# Patient Record
Sex: Female | Born: 1995 | Race: White | Hispanic: No | Marital: Single | Smoking: Current every day smoker
Health system: Southern US, Community
[De-identification: ages and names within clinical notes are randomized; demographics above are authoritative.]

## PROBLEM LIST (undated history)

## (undated) ENCOUNTER — Inpatient Hospital Stay (HOSPITAL_COMMUNITY): Payer: Self-pay

## (undated) ENCOUNTER — Emergency Department (HOSPITAL_COMMUNITY): Admission: EM | Payer: Medicaid Other

## (undated) DIAGNOSIS — IMO0002 Reserved for concepts with insufficient information to code with codable children: Secondary | ICD-10-CM

## (undated) DIAGNOSIS — F329 Major depressive disorder, single episode, unspecified: Secondary | ICD-10-CM

## (undated) DIAGNOSIS — F419 Anxiety disorder, unspecified: Secondary | ICD-10-CM

## (undated) DIAGNOSIS — T148XXA Other injury of unspecified body region, initial encounter: Secondary | ICD-10-CM

## (undated) DIAGNOSIS — F32A Depression, unspecified: Secondary | ICD-10-CM

## (undated) DIAGNOSIS — F4310 Post-traumatic stress disorder, unspecified: Secondary | ICD-10-CM

## (undated) HISTORY — DX: Post-traumatic stress disorder, unspecified: F43.10

## (undated) HISTORY — PX: TONSILLECTOMY AND ADENOIDECTOMY: SUR1326

## (undated) HISTORY — DX: Anxiety disorder, unspecified: F41.9

## (undated) HISTORY — PX: OTHER SURGICAL HISTORY: SHX169

---
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
## 2013-03-20 ENCOUNTER — Encounter (HOSPITAL_COMMUNITY): Payer: Self-pay | Admitting: *Deleted

## 2013-03-20 ENCOUNTER — Inpatient Hospital Stay (HOSPITAL_COMMUNITY)
Admission: AD | Admit: 2013-03-20 | Discharge: 2013-03-20 | Disposition: A | Discharge status: Home / Self Care | Payer: Medicaid Other | Source: Ambulatory Visit | Attending: Obstetrics & Gynecology | Admitting: Obstetrics & Gynecology

## 2013-03-20 DIAGNOSIS — O212 Late vomiting of pregnancy: Secondary | ICD-10-CM | POA: Insufficient documentation

## 2013-03-20 DIAGNOSIS — O479 False labor, unspecified: Secondary | ICD-10-CM | POA: Insufficient documentation

## 2013-03-20 LAB — OB RESULTS CONSOLE GC/CHLAMYDIA: Gonorrhea: NEGATIVE

## 2013-03-20 LAB — URINALYSIS, ROUTINE W REFLEX MICROSCOPIC
Bilirubin Urine: NEGATIVE
Hgb urine dipstick: NEGATIVE
Ketones, ur: NEGATIVE mg/dL
Nitrite: NEGATIVE
Urobilinogen, UA: 1 mg/dL (ref 0.0–1.0)
pH: 7 (ref 5.0–8.0)

## 2013-03-20 LAB — COMPREHENSIVE METABOLIC PANEL
ALT: 8 U/L (ref 0–35)
Alkaline Phosphatase: 207 U/L — ABNORMAL HIGH (ref 39–117)
CO2: 19 mEq/L (ref 19–32)
Chloride: 105 mEq/L (ref 96–112)
GFR calc Af Amer: >90 mL/min (ref >90)
GFR calc non Af Amer: >90 mL/min (ref >90)
Glucose, Bld: 79 mg/dL (ref 70–99)
Potassium: 3.5 mEq/L (ref 3.5–5.1)
Sodium: 136 mEq/L (ref 135–145)

## 2013-03-20 LAB — CBC
HCT: 33.2 % — ABNORMAL LOW (ref 36.0–46.0)
MCH: 27.2 pg (ref 26.0–34.0)
MCHC: 33.4 g/dL (ref 30.0–36.0)
MCV: 81.4 fL (ref 78.0–100.0)
RDW: 14.9 % (ref 11.5–15.5)

## 2013-03-20 MED ORDER — LACTATED RINGERS IV BOLUS (SEPSIS)
500.0000 mL | Freq: Once | INTRAVENOUS | Status: AC
  Administered 2013-03-20: 500 mL via INTRAVENOUS

## 2013-03-20 NOTE — MAU Note (Signed)
Pt presents with possible some at 9 pm last night.  Pt with only occasional contractions.  Pt with nausea and vomiting 5 times today.  Pt denies bleeding.  + fm per pt.

## 2013-04-08 ENCOUNTER — Inpatient Hospital Stay (HOSPITAL_COMMUNITY)
Admission: AD | Admit: 2013-04-08 | Discharge: 2013-04-08 | Disposition: A | Discharge status: Home / Self Care | Payer: Medicaid Other | Source: Ambulatory Visit | Attending: Obstetrics and Gynecology | Admitting: Obstetrics and Gynecology

## 2013-04-08 ENCOUNTER — Encounter (HOSPITAL_COMMUNITY): Payer: Self-pay | Admitting: *Deleted

## 2013-04-08 DIAGNOSIS — O479 False labor, unspecified: Secondary | ICD-10-CM | POA: Insufficient documentation

## 2013-04-08 NOTE — MAU Note (Signed)
Pt reports having ctx on and off since 4am . repots clear mucusy discharge and good fetal movement

## 2013-04-10 ENCOUNTER — Encounter (HOSPITAL_COMMUNITY): Payer: Self-pay | Admitting: Family

## 2013-04-10 ENCOUNTER — Inpatient Hospital Stay (HOSPITAL_COMMUNITY)
Admission: AD | Admit: 2013-04-10 | Discharge: 2013-04-10 | Disposition: A | Discharge status: Home / Self Care | Payer: Medicaid Other | Source: Ambulatory Visit | Attending: Obstetrics & Gynecology | Admitting: Obstetrics & Gynecology

## 2013-04-10 DIAGNOSIS — O368190 Decreased fetal movements, unspecified trimester, not applicable or unspecified: Secondary | ICD-10-CM | POA: Insufficient documentation

## 2013-04-10 DIAGNOSIS — R1013 Epigastric pain: Secondary | ICD-10-CM | POA: Insufficient documentation

## 2013-04-10 NOTE — MAU Note (Signed)
Patient reports smoking cessation approximately 2 weeks ago. Denies cigarette use since that time.

## 2013-04-10 NOTE — MAU Note (Signed)
No movement felt today.  Sharp, constant pain in upper mid abd, lot of pressure up there.increased d/c last couple days, blood mixed in . Was 2+ on sat when checked here.

## 2013-04-10 NOTE — MAU Note (Addendum)
Patient presents to MAU with c/o decreased FM - reports no movement today; patient reports she walked around and was on her feet more than usual this weekend.  Reports bloody, mucous show. Reports constant stabbing upper abdominal/epigastric pain. Denies HA, blurred vision.  Was seen at MAU on Saturday, SVE 2.5/3 cm. Hx of induction/VD at 41 wks with last pregnancy. Denies HSV.

## 2013-04-11 ENCOUNTER — Inpatient Hospital Stay (HOSPITAL_COMMUNITY)
Admission: AD | Admit: 2013-04-11 | Discharge: 2013-04-13 | DRG: 775 | Disposition: A | Discharge status: Home / Self Care | Payer: Medicaid Other | Source: Ambulatory Visit | Attending: Obstetrics and Gynecology | Admitting: Obstetrics and Gynecology

## 2013-04-11 ENCOUNTER — Encounter (HOSPITAL_COMMUNITY): Payer: Self-pay | Admitting: *Deleted

## 2013-04-11 ENCOUNTER — Inpatient Hospital Stay (HOSPITAL_COMMUNITY): Payer: Medicaid Other | Admitting: Anesthesiology

## 2013-04-11 ENCOUNTER — Encounter (HOSPITAL_COMMUNITY): Payer: Self-pay | Admitting: Anesthesiology

## 2013-04-11 DIAGNOSIS — O368190 Decreased fetal movements, unspecified trimester, not applicable or unspecified: Principal | ICD-10-CM | POA: Diagnosis present

## 2013-04-11 DIAGNOSIS — Z22330 Carrier of Group B streptococcus: Secondary | ICD-10-CM

## 2013-04-11 DIAGNOSIS — O99892 Other specified diseases and conditions complicating childbirth: Secondary | ICD-10-CM | POA: Diagnosis present

## 2013-04-11 LAB — CBC
HCT: 34.2 % — ABNORMAL LOW (ref 36.0–46.0)
Hemoglobin: 11.4 g/dL — ABNORMAL LOW (ref 12.0–15.0)
MCH: 26.6 pg (ref 26.0–34.0)
MCHC: 33.3 g/dL (ref 30.0–36.0)
MCV: 79.7 fL (ref 78.0–100.0)
RBC: 4.29 MIL/uL (ref 3.87–5.11)

## 2013-04-11 MED ORDER — OXYTOCIN BOLUS FROM INFUSION
500.0000 mL | INTRAVENOUS | Status: DC
  Administered 2013-04-11: 500 mL via INTRAVENOUS

## 2013-04-11 MED ORDER — MEASLES, MUMPS & RUBELLA VAC ~~LOC~~ INJ
0.5000 mL | INJECTION | Freq: Once | SUBCUTANEOUS | Status: DC
  Filled 2013-04-11: qty 0.5

## 2013-04-11 MED ORDER — PRENATAL MULTIVITAMIN CH
1.0000 | ORAL_TABLET | Freq: Every day | ORAL | Status: DC
  Administered 2013-04-12: 1 via ORAL
  Filled 2013-04-11: qty 1

## 2013-04-11 MED ORDER — DIPHENHYDRAMINE HCL 50 MG/ML IJ SOLN: 12.5000 mg | INTRAMUSCULAR | Status: DC | PRN

## 2013-04-11 MED ORDER — LACTATED RINGERS IV SOLN
INTRAVENOUS | Status: DC
  Administered 2013-04-11: 17:00:00 via INTRAVENOUS
  Administered 2013-04-11: 125 mL/h via INTRAVENOUS

## 2013-04-11 MED ORDER — LACTATED RINGERS IV SOLN
500.0000 mL | Freq: Once | INTRAVENOUS | Status: AC
  Administered 2013-04-11: 500 mL via INTRAVENOUS

## 2013-04-11 MED ORDER — PHENYLEPHRINE 40 MCG/ML (10ML) SYRINGE FOR IV PUSH (FOR BLOOD PRESSURE SUPPORT)
80.0000 ug | PREFILLED_SYRINGE | INTRAVENOUS | Status: DC | PRN
  Filled 2013-04-11: qty 5
  Filled 2013-04-11: qty 2

## 2013-04-11 MED ORDER — METHYLERGONOVINE MALEATE 0.2 MG/ML IJ SOLN: 0.2000 mg | INTRAMUSCULAR | Status: DC | PRN

## 2013-04-11 MED ORDER — SENNOSIDES-DOCUSATE SODIUM 8.6-50 MG PO TABS
2.0000 | ORAL_TABLET | Freq: Every day | ORAL | Status: DC
  Administered 2013-04-12: 2 via ORAL

## 2013-04-11 MED ORDER — SODIUM CHLORIDE 0.9 % IV SOLN: 250.0000 mL | INTRAVENOUS | Status: DC | PRN

## 2013-04-11 MED ORDER — SODIUM CHLORIDE 0.9 % IJ SOLN: 3.0000 mL | INTRAMUSCULAR | Status: DC | PRN

## 2013-04-11 MED ORDER — ONDANSETRON HCL 4 MG PO TABS: 4.0000 mg | ORAL_TABLET | ORAL | Status: DC | PRN

## 2013-04-11 MED ORDER — SODIUM BICARBONATE 8.4 % IV SOLN
INTRAVENOUS | Status: DC | PRN
  Administered 2013-04-11: 5 mL via EPIDURAL

## 2013-04-11 MED ORDER — PHENYLEPHRINE 40 MCG/ML (10ML) SYRINGE FOR IV PUSH (FOR BLOOD PRESSURE SUPPORT)
80.0000 ug | PREFILLED_SYRINGE | INTRAVENOUS | Status: DC | PRN
  Filled 2013-04-11: qty 2

## 2013-04-11 MED ORDER — TETANUS-DIPHTH-ACELL PERTUSSIS 5-2.5-18.5 LF-MCG/0.5 IM SUSP
0.5000 mL | Freq: Once | INTRAMUSCULAR | Status: AC
  Administered 2013-04-12: 0.5 mL via INTRAMUSCULAR

## 2013-04-11 MED ORDER — SODIUM CHLORIDE 0.9 % IJ SOLN: 3.0000 mL | Freq: Two times a day (BID) | INTRAMUSCULAR | Status: DC

## 2013-04-11 MED ORDER — LIDOCAINE HCL (PF) 1 % IJ SOLN
30.0000 mL | INTRAMUSCULAR | Status: DC | PRN
  Administered 2013-04-11: 30 mL via SUBCUTANEOUS
  Filled 2013-04-11 (×2): qty 30

## 2013-04-11 MED ORDER — DIBUCAINE 1 % RE OINT: 1.0000 "application " | TOPICAL_OINTMENT | RECTAL | Status: DC | PRN

## 2013-04-11 MED ORDER — EPHEDRINE 5 MG/ML INJ
10.0000 mg | INTRAVENOUS | Status: DC | PRN
  Filled 2013-04-11: qty 4
  Filled 2013-04-11: qty 2

## 2013-04-11 MED ORDER — SIMETHICONE 80 MG PO CHEW: 80.0000 mg | CHEWABLE_TABLET | ORAL | Status: DC | PRN

## 2013-04-11 MED ORDER — METHYLERGONOVINE MALEATE 0.2 MG PO TABS
ORAL_TABLET | ORAL | Status: AC
  Filled 2013-04-11: qty 1

## 2013-04-11 MED ORDER — METHYLERGONOVINE MALEATE 0.2 MG PO TABS
0.2000 mg | ORAL_TABLET | ORAL | Status: DC | PRN
  Administered 2013-04-11: 0.2 mg via ORAL

## 2013-04-11 MED ORDER — OXYCODONE-ACETAMINOPHEN 5-325 MG PO TABS: 1.0000 | ORAL_TABLET | ORAL | Status: DC | PRN

## 2013-04-11 MED ORDER — DIPHENHYDRAMINE HCL 25 MG PO CAPS: 25.0000 mg | ORAL_CAPSULE | Freq: Four times a day (QID) | ORAL | Status: DC | PRN

## 2013-04-11 MED ORDER — IBUPROFEN 800 MG PO TABS
800.0000 mg | ORAL_TABLET | Freq: Three times a day (TID) | ORAL | Status: DC
  Administered 2013-04-12 – 2013-04-13 (×3): 800 mg via ORAL
  Filled 2013-04-11 (×3): qty 1

## 2013-04-11 MED ORDER — WITCH HAZEL-GLYCERIN EX PADS: 1.0000 "application " | MEDICATED_PAD | CUTANEOUS | Status: DC | PRN

## 2013-04-11 MED ORDER — FERROUS SULFATE 325 (65 FE) MG PO TABS
325.0000 mg | ORAL_TABLET | Freq: Two times a day (BID) | ORAL | Status: DC
  Administered 2013-04-12 – 2013-04-13 (×3): 325 mg via ORAL
  Filled 2013-04-11 (×4): qty 1

## 2013-04-11 MED ORDER — OXYTOCIN 40 UNITS IN LACTATED RINGERS INFUSION - SIMPLE MED: 62.5000 mL/h | INTRAVENOUS | Status: DC

## 2013-04-11 MED ORDER — ZOLPIDEM TARTRATE 5 MG PO TABS: 5.0000 mg | ORAL_TABLET | Freq: Every evening | ORAL | Status: DC | PRN

## 2013-04-11 MED ORDER — IBUPROFEN 600 MG PO TABS
600.0000 mg | ORAL_TABLET | Freq: Four times a day (QID) | ORAL | Status: DC | PRN
  Filled 2013-04-11: qty 1

## 2013-04-11 MED ORDER — OXYCODONE-ACETAMINOPHEN 5-325 MG PO TABS
1.0000 | ORAL_TABLET | ORAL | Status: DC | PRN
  Administered 2013-04-12 (×2): 1 via ORAL
  Administered 2013-04-12 – 2013-04-13 (×2): 2 via ORAL
  Filled 2013-04-11 (×2): qty 2
  Filled 2013-04-11 (×2): qty 1

## 2013-04-11 MED ORDER — MAGNESIUM HYDROXIDE 400 MG/5ML PO SUSP: 30.0000 mL | ORAL | Status: DC | PRN

## 2013-04-11 MED ORDER — LACTATED RINGERS IV SOLN: 500.0000 mL | INTRAVENOUS | Status: DC | PRN

## 2013-04-11 MED ORDER — CITRIC ACID-SODIUM CITRATE 334-500 MG/5ML PO SOLN: 30.0000 mL | ORAL | Status: DC | PRN

## 2013-04-11 MED ORDER — ONDANSETRON HCL 4 MG/2ML IJ SOLN: 4.0000 mg | Freq: Four times a day (QID) | INTRAMUSCULAR | Status: DC | PRN

## 2013-04-11 MED ORDER — LANOLIN HYDROUS EX OINT: TOPICAL_OINTMENT | CUTANEOUS | Status: DC | PRN

## 2013-04-11 MED ORDER — ONDANSETRON HCL 4 MG/2ML IJ SOLN: 4.0000 mg | INTRAMUSCULAR | Status: DC | PRN

## 2013-04-11 MED ORDER — FLEET ENEMA 7-19 GM/118ML RE ENEM: 1.0000 | ENEMA | RECTAL | Status: DC | PRN

## 2013-04-11 MED ORDER — IBUPROFEN 800 MG PO TABS
800.0000 mg | ORAL_TABLET | Freq: Three times a day (TID) | ORAL | Status: DC
  Administered 2013-04-11: 800 mg via ORAL

## 2013-04-11 MED ORDER — FENTANYL 2.5 MCG/ML BUPIVACAINE 1/10 % EPIDURAL INFUSION (WH - ANES)
14.0000 mL/h | INTRAMUSCULAR | Status: DC | PRN
  Administered 2013-04-11: 14 mL/h via EPIDURAL
  Filled 2013-04-11: qty 125

## 2013-04-11 MED ORDER — BENZOCAINE-MENTHOL 20-0.5 % EX AERO: 1.0000 "application " | INHALATION_SPRAY | CUTANEOUS | Status: DC | PRN

## 2013-04-11 MED ORDER — OXYTOCIN 40 UNITS IN LACTATED RINGERS INFUSION - SIMPLE MED
1.0000 m[IU]/min | INTRAVENOUS | Status: DC
  Administered 2013-04-11: 2 m[IU]/min via INTRAVENOUS
  Filled 2013-04-11: qty 1000

## 2013-04-11 MED ORDER — SODIUM CHLORIDE 0.9 % IV SOLN
2.0000 g | Freq: Four times a day (QID) | INTRAVENOUS | Status: DC
  Administered 2013-04-11: 2 g via INTRAVENOUS
  Filled 2013-04-11 (×2): qty 2000

## 2013-04-11 MED ORDER — TERBUTALINE SULFATE 1 MG/ML IJ SOLN: 0.2500 mg | Freq: Once | INTRAMUSCULAR | Status: DC | PRN

## 2013-04-11 MED ORDER — ACETAMINOPHEN 325 MG PO TABS
650.0000 mg | ORAL_TABLET | ORAL | Status: DC | PRN
  Administered 2013-04-11: 650 mg via ORAL
  Filled 2013-04-11: qty 2

## 2013-04-11 MED ORDER — EPHEDRINE 5 MG/ML INJ
10.0000 mg | INTRAVENOUS | Status: DC | PRN
  Filled 2013-04-11: qty 2

## 2013-04-11 NOTE — Plan of Care (Signed)
Problem: Consults Goal: Birthing Suites Patient Information Press F2 to bring up selections list  Outcome: Completed/Met Date Met:  04/11/13  Pt > [redacted] weeks EGA

## 2013-04-11 NOTE — H&P (Signed)
17 y.o. [redacted]w[redacted]d  G2P1001 comes in c/o decreased FM again but when checked today was found to be 5 cm.  Otherwise has good fetal movement and no bleeding.  Past Medical History  Diagnosis Date  . Medical history non-contributory     Past Surgical History  Procedure Laterality Date  . Tonsillectomy    . Adnoids      OB History  Gravida Para Term Preterm AB SAB TAB Ectopic Multiple Living  2 1 1       1     # Outcome Date GA Lbr Len/2nd Weight Sex Delivery Anes PTL Lv  2 CUR           1 TRM     F SVD EPI N Y      History   Social History  . Marital Status: Single    Spouse Name: N/A    Number of Children: N/A  . Years of Education: N/A   Occupational History  . Not on file.   Social History Main Topics  . Smoking status: Former Smoker -- 0.25 packs/day    Types: Cigarettes    Quit date: 03/12/2013  . Smokeless tobacco: Not on file  . Alcohol Use: No  . Drug Use: No  . Sexual Activity: Yes   Other Topics Concern  . Not on file   Social History Narrative  . No narrative on file   Review of patient's allergies indicates no known allergies.    Prenatal Transfer Tool  Maternal Diabetes: No Genetic Screening: Declined Maternal Ultrasounds/Referrals: Normal Fetal Ultrasounds or other Referrals:  None Maternal Substance Abuse:  No Significant Maternal Medications:  None Significant Maternal Lab Results: None  Other PNC:    Filed Vitals:   04/11/13 1621  BP: 119/70  Pulse: 92  Temp: 98.2 F (36.8 C)  Resp: 18     Lungs/Cor:  NAD Abdomen:  soft, gravid Ex:  no cords, erythema SVE:  5/C/-2, BBOW FHTs:  140s, good STV, NST R Toco:  q4-5   A/P   Term labor with reassuring FHTs.  GBS pos on UCX- PCN.  Victorhugo Preis A

## 2013-04-11 NOTE — Anesthesia Preprocedure Evaluation (Signed)

## 2013-04-11 NOTE — Anesthesia Procedure Notes (Signed)

## 2013-04-11 NOTE — MAU Note (Signed)
Patient is in with c/o no fetal movement all day and ctx q5-74mins for 3 hours. She denies vaginal bleeding or lof. fht 163-164. Patient states that she was 2cm last week

## 2013-04-12 LAB — RPR: RPR Ser Ql: NONREACTIVE

## 2013-04-12 LAB — CBC
HCT: 34.6 % — ABNORMAL LOW (ref 36.0–46.0)
Hemoglobin: 11.3 g/dL — ABNORMAL LOW (ref 12.0–15.0)
WBC: 19.9 10*3/uL — ABNORMAL HIGH (ref 4.0–10.5)

## 2013-04-12 NOTE — Anesthesia Postprocedure Evaluation (Signed)
  Anesthesia Post-op Note  Patient: Carrie Peterson  Procedure(s) Performed: Lumbar Epidural for L&D  Patient Location: Mother/Baby  Anesthesia Type:Epidural  Level of Consciousness: awake, alert  and oriented  Airway and Oxygen Therapy: Patient Spontanous Breathing  Post-op Pain: 7 /10. Patient complaining of Right sided lower back pain radiating up to her mid thoracic area on the right side. Tender to palpation of right paraspinous muscles. Pain began approximately 2 1/2 hours ago. Suspect she has paraspinous muscle strain which manifested after full effects of epidural analgesia has subsided.  Post-op Assessment: Post-op Vital signs reviewed, Patient's Cardiovascular Status Stable, Respiratory Function Stable, Patent Airway, No signs of Nausea or vomiting, No headache, No residual numbness and No residual motor weakness  Post-op Vital Signs: Reviewed and stable  Complications: No apparent anesthesia complications. Sus

## 2013-04-12 NOTE — Progress Notes (Signed)
Patient is eating, ambulating, voiding.  Pain control is good.  Appropriate lochia.  No complaints.  Filed Vitals:   04/11/13 2247 04/11/13 2320 04/12/13 0054 04/12/13 0537  BP: 112/74  122/79 110/75  Pulse: 89 54 75 62  Temp:  98.5 F (36.9 C) 97.7 F (36.5 C) 97.5 F (36.4 C)  TempSrc:  Oral Oral Oral  Resp: 18 18 18 18   Height:      Weight:      SpO2:   98% 98%    Fundus firm Perineum without swelling. No CT  Lab Results  Component Value Date   WBC 19.9* 04/12/2013   HGB 11.3* 04/12/2013   HCT 34.6* 04/12/2013   MCV 80.5 04/12/2013   PLT 195 04/12/2013    --/--/B POS (01/24 1758)  A/P Post partum day 1 ( from 10p).  Routine care.  Expect d/c 9/4  Philip Aspen

## 2013-04-12 NOTE — Progress Notes (Signed)
UR chart review completed.  

## 2013-04-12 NOTE — Progress Notes (Signed)
1610 Pt complained of shooting pain up her right side of her back from the midpoint up.. Assessed the  back area no signs of redness or swelling.  Pt ambulated in the room, no complaints of numbness.  Carrie Goodell MD.  Order given to apply ice pack and if that does not help,  apply heat.  MD will assign health care personnel to evaluate patient shortly.  Ice pack applied.  Will continue to monitor. Carrie Peterson

## 2013-04-13 MED ORDER — DOCUSATE SODIUM 100 MG PO CAPS: 100.0000 mg | ORAL_CAPSULE | Freq: Two times a day (BID) | ORAL | Status: DC

## 2013-04-13 MED ORDER — OXYCODONE-ACETAMINOPHEN 5-325 MG PO TABS: 2.0000 | ORAL_TABLET | ORAL | Status: DC | PRN

## 2013-04-13 MED ORDER — IBUPROFEN 600 MG PO TABS: 600.0000 mg | ORAL_TABLET | Freq: Four times a day (QID) | ORAL | Status: DC | PRN

## 2013-04-13 NOTE — Discharge Summary (Signed)
Obstetric Discharge Summary Reason for Admission: induction of labor Prenatal Procedures: ultrasound Intrapartum Procedures: spontaneous vaginal delivery Postpartum Procedures: none Complications-Operative and Postpartum: right labial laceration Hemoglobin  Date Value Range Status  04/12/2013 11.3* 12.0 - 15.0 g/dL Final     HCT  Date Value Range Status  04/12/2013 34.6* 36.0 - 46.0 % Final    Physical Exam:  General: alert, cooperative and appears stated age Lochia: appropriate Uterine Fundus: firm  Discharge Diagnoses: Term Pregnancy-delivered  Discharge Information: Date: 04/13/2013 Activity: pelvic rest Diet: routine Medications: Ibuprofen, Colace and Percocet Condition: improved Instructions: refer to practice specific booklet Discharge to: home Follow-up Information   Follow up with HORVATH,MICHELLE A, MD In 4 weeks. (For a postpartm evaluation)    Specialty:  Obstetrics and Gynecology   Contact information:   [address] [place] [address] [phone]       Newborn Data: Live born female  Birth Weight: 8 lb 15 oz (4055 g) APGAR: 8, 9  Home with mother.  Cele Mote H. 04/13/2013, 8:27 AM

## 2014-02-05 DIAGNOSIS — R1110 Vomiting, unspecified: Secondary | ICD-10-CM | POA: Insufficient documentation

## 2014-02-11 ENCOUNTER — Emergency Department (HOSPITAL_COMMUNITY)
Admission: EM | Admit: 2014-02-11 | Discharge: 2014-02-11 | Disposition: A | Discharge status: Home / Self Care | Payer: Managed Care, Other (non HMO) | Attending: Emergency Medicine | Admitting: Emergency Medicine

## 2014-02-11 ENCOUNTER — Encounter (HOSPITAL_COMMUNITY): Payer: Self-pay | Admitting: Emergency Medicine

## 2014-02-11 DIAGNOSIS — Z792 Long term (current) use of antibiotics: Secondary | ICD-10-CM | POA: Insufficient documentation

## 2014-02-11 DIAGNOSIS — T3695XA Adverse effect of unspecified systemic antibiotic, initial encounter: Secondary | ICD-10-CM | POA: Insufficient documentation

## 2014-02-11 DIAGNOSIS — Z87891 Personal history of nicotine dependence: Secondary | ICD-10-CM | POA: Insufficient documentation

## 2014-02-11 DIAGNOSIS — Z8719 Personal history of other diseases of the digestive system: Secondary | ICD-10-CM | POA: Insufficient documentation

## 2014-02-11 DIAGNOSIS — T471X5A Adverse effect of other antacids and anti-gastric-secretion drugs, initial encounter: Secondary | ICD-10-CM | POA: Insufficient documentation

## 2014-02-11 DIAGNOSIS — F3289 Other specified depressive episodes: Secondary | ICD-10-CM | POA: Insufficient documentation

## 2014-02-11 DIAGNOSIS — Z3202 Encounter for pregnancy test, result negative: Secondary | ICD-10-CM | POA: Insufficient documentation

## 2014-02-11 DIAGNOSIS — Z79899 Other long term (current) drug therapy: Secondary | ICD-10-CM | POA: Insufficient documentation

## 2014-02-11 DIAGNOSIS — F329 Major depressive disorder, single episode, unspecified: Secondary | ICD-10-CM | POA: Insufficient documentation

## 2014-02-11 DIAGNOSIS — T50905A Adverse effect of unspecified drugs, medicaments and biological substances, initial encounter: Secondary | ICD-10-CM

## 2014-02-11 DIAGNOSIS — R197 Diarrhea, unspecified: Secondary | ICD-10-CM | POA: Insufficient documentation

## 2014-02-11 DIAGNOSIS — R112 Nausea with vomiting, unspecified: Secondary | ICD-10-CM | POA: Insufficient documentation

## 2014-02-11 DIAGNOSIS — R1012 Left upper quadrant pain: Secondary | ICD-10-CM | POA: Insufficient documentation

## 2014-02-11 DIAGNOSIS — T43205A Adverse effect of unspecified antidepressants, initial encounter: Secondary | ICD-10-CM | POA: Insufficient documentation

## 2014-02-11 HISTORY — DX: Major depressive disorder, single episode, unspecified: F32.9

## 2014-02-11 HISTORY — DX: Depression, unspecified: F32.A

## 2014-02-11 HISTORY — DX: Reserved for concepts with insufficient information to code with codable children: IMO0002

## 2014-02-11 LAB — BASIC METABOLIC PANEL
Anion gap: 13 (ref 5–15)
BUN: 4 mg/dL — AB (ref 6–23)
CALCIUM: 9.3 mg/dL (ref 8.4–10.5)
CO2: 21 mEq/L (ref 19–32)
Chloride: 108 mEq/L (ref 96–112)
Creatinine, Ser: 0.87 mg/dL (ref 0.50–1.10)
GFR calc Af Amer: >90 mL/min (ref >90)
Glucose, Bld: 87 mg/dL (ref 70–99)
Potassium: 3.5 mEq/L — ABNORMAL LOW (ref 3.7–5.3)
Sodium: 142 mEq/L (ref 137–147)

## 2014-02-11 LAB — CBC WITH DIFFERENTIAL/PLATELET
BASOS PCT: 0 % (ref 0–1)
Basophils Absolute: 0 10*3/uL (ref 0.0–0.1)
EOS ABS: 0.1 10*3/uL (ref 0.0–0.7)
Eosinophils Relative: 1 % (ref 0–5)
HCT: 39.9 % (ref 36.0–46.0)
HEMOGLOBIN: 13.6 g/dL (ref 12.0–15.0)
Lymphocytes Relative: 19 % (ref 12–46)
Lymphs Abs: 1.7 10*3/uL (ref 0.7–4.0)
MCH: 30.4 pg (ref 26.0–34.0)
MCHC: 34.1 g/dL (ref 30.0–36.0)
MCV: 89.3 fL (ref 78.0–100.0)
MONOS PCT: 6 % (ref 3–12)
Monocytes Absolute: 0.6 10*3/uL (ref 0.1–1.0)
Neutro Abs: 6.9 10*3/uL (ref 1.7–7.7)
Neutrophils Relative %: 74 % (ref 43–77)
Platelets: 250 10*3/uL (ref 150–400)
RBC: 4.47 MIL/uL (ref 3.87–5.11)
RDW: 14.6 % (ref 11.5–15.5)
WBC: 9.3 10*3/uL (ref 4.0–10.5)

## 2014-02-11 LAB — URINE MICROSCOPIC-ADD ON

## 2014-02-11 LAB — POC OCCULT BLOOD, ED: Fecal Occult Bld: NEGATIVE

## 2014-02-11 LAB — URINALYSIS, ROUTINE W REFLEX MICROSCOPIC
Bilirubin Urine: NEGATIVE
GLUCOSE, UA: NEGATIVE mg/dL
KETONES UR: NEGATIVE mg/dL
LEUKOCYTES UA: NEGATIVE
Nitrite: NEGATIVE
PH: 7.5 (ref 5.0–8.0)
Protein, ur: NEGATIVE mg/dL
Specific Gravity, Urine: 1.008 (ref 1.005–1.030)
Urobilinogen, UA: 0.2 mg/dL (ref 0.0–1.0)

## 2014-02-11 LAB — POC URINE PREG, ED: Preg Test, Ur: NEGATIVE

## 2014-02-11 LAB — LIPASE, BLOOD: Lipase: 35 U/L (ref 11–59)

## 2014-02-11 MED ORDER — ONDANSETRON HCL 4 MG PO TABS: 4.0000 mg | ORAL_TABLET | Freq: Four times a day (QID) | ORAL | Status: DC

## 2014-02-11 MED ORDER — GI COCKTAIL ~~LOC~~
30.0000 mL | Freq: Once | ORAL | Status: AC
  Administered 2014-02-11: 30 mL via ORAL
  Filled 2014-02-11: qty 30

## 2014-02-11 NOTE — ED Notes (Signed)
Pt escorted to discharge window. Pt verbalized understanding discharge instructions. In no acute distress.  

## 2014-02-11 NOTE — ED Provider Notes (Signed)
CSN: [id number]     Arrival date & time 02/11/14  1242 History   First MD Initiated Contact with Patient 02/11/14 1243     Chief Complaint  Patient presents with  . Abdominal Pain     (Consider location/radiation/quality/duration/timing/severity/associated sxs/prior Treatment) Patient is a [age] y.o. female presenting with abdominal pain.  Abdominal Pain Pain location:  LUQ Pain quality: sharp   Pain radiates to:  Does not radiate Pain severity:  Severe Onset quality:  Gradual Duration:  1 day Timing:  Constant Progression:  Unchanged Chronicity:  New Context comment:  Recently diagnosed with H pylori (did not have abdominal pain with this diagnosis, just abd tenderness).  Started Triple therapy yesterday Relieved by:  Nothing Worsened by:  Nothing tried Associated symptoms: diarrhea, nausea and vomiting   Associated symptoms: no dysuria, no fever and no shortness of breath   Diarrhea:    Diarrhea characteristics: dark, with bright blood. Vomiting:    Emesis appearance: frothy, streaked with blood.   Past Medical History  Diagnosis Date  . Medical history non-contributory   . Ulcer   . Depression    Past Surgical History  Procedure Laterality Date  . Tonsillectomy    . Adnoids     Family History  Problem Relation Age of Onset  . Alcohol abuse Neg Hx   . Arthritis Neg Hx   . Asthma Neg Hx   . Birth defects Neg Hx   . Cancer Neg Hx   . COPD Neg Hx   . Depression Neg Hx   . Diabetes Neg Hx   . Drug abuse Neg Hx   . Early death Neg Hx   . Hearing loss Neg Hx   . Heart disease Neg Hx   . Hypertension Neg Hx   . Kidney disease Neg Hx   . Hyperlipidemia Neg Hx   . Learning disabilities Neg Hx   . Mental illness Neg Hx   . Mental retardation Neg Hx   . Miscarriages / Stillbirths Neg Hx   . Stroke Neg Hx   . Vision loss Neg Hx    History  Substance Use Topics  . Smoking status: Former Smoker -- 0.25 packs/day    Types: Cigarettes    Quit date: 03/12/2013  .  Smokeless tobacco: Not on file  . Alcohol Use: No   OB History   Grav Para Term Preterm Abortions TAB SAB Ect Mult Living   2 2 2       2      Review of Systems  Constitutional: Negative for fever.  Respiratory: Negative for shortness of breath.   Gastrointestinal: Positive for nausea, vomiting, abdominal pain and diarrhea.  Genitourinary: Negative for dysuria.  All other systems reviewed and are negative.     Allergies  Review of patient's allergies indicates no known allergies.  Home Medications   Prior to Admission medications   Medication Sig Start Date End Date Taking? Authorizing Provider  amoxicillin-clarithromycin-lansoprazole [facility](PREVPAC) combo pack Take by mouth 2 (two) times daily. Follow package directions.   Yes Historical Provider, MD  buPROPion (WELLBUTRIN SR) 150 MG 12 hr tablet Take 150 mg by mouth 2 (two) times daily.   Yes Historical Provider, MD  DiphenhydrAMINE HCl, Sleep, (ZZZQUIL) 25 MG CAPS Take 2 capsules by mouth 3 times/day as needed-between meals & bedtime (for sleep).   Yes Historical Provider, MD  etonogestrel (NEXPLANON) 68 MG IMPL implant Inject 1 each into the skin once.   Yes Historical Provider, MD  omeprazole (  PRILOSEC) 20 MG capsule Take 20 mg by mouth daily.   Yes Historical Provider, MD   BP 116/73  Pulse 105  Temp(Src) 98.2 F (36.8 C) (Oral)  Resp 20  Wt 139 lb (63.05 kg)  SpO2 99%  LMP 02/07/2014  Breastfeeding? No Physical Exam  Nursing note and vitals reviewed. Constitutional: She is oriented to person, place, and time. She appears well-developed and well-nourished. No distress.  HENT:  Head: Normocephalic and atraumatic.  Mouth/Throat: Oropharynx is clear and moist.  Eyes: Conjunctivae are normal. Pupils are equal, round, and reactive to light. No scleral icterus.  Neck: Neck supple.  Cardiovascular: Normal rate, regular rhythm, normal heart sounds and intact distal pulses.   No murmur heard. Pulmonary/Chest: Effort normal and  breath sounds normal. No stridor. No respiratory distress. She has no rales.  Abdominal: Soft. Bowel sounds are normal. She exhibits no distension. There is tenderness in the left upper quadrant. There is no rigidity, no rebound, no guarding and no CVA tenderness.  Genitourinary: Rectal exam shows tenderness. Rectal exam shows no fissure and no mass. Guaiac negative stool (light brown stool).  Musculoskeletal: Normal range of motion.  Neurological: She is alert and oriented to person, place, and time.  Skin: Skin is warm and dry. No rash noted.  Psychiatric: She has a normal mood and affect. Her behavior is normal.    ED Course  Procedures (including critical care time) Labs Review Labs Reviewed  BASIC METABOLIC PANEL - Abnormal; Notable for the following:    Potassium 3.5 (*)    BUN 4 (*)    All other components within normal limits  URINALYSIS, ROUTINE W REFLEX MICROSCOPIC - Abnormal; Notable for the following:    APPearance CLOUDY (*)    Hgb urine dipstick MODERATE (*)    All other components within normal limits  URINE MICROSCOPIC-ADD ON - Abnormal; Notable for the following:    Squamous Epithelial / LPF FEW (*)    All other components within normal limits  CBC WITH DIFFERENTIAL  LIPASE, BLOOD  POC OCCULT BLOOD, ED  POC URINE PREG, ED    Imaging Review No results found.   EKG Interpretation None      MDM   Final diagnoses:  Abdominal pain, LUQ  Medication adverse effect, initial encounter    18 year old female presenting with left upper quadrant abdominal pain, nausea, vomiting, diarrhea. Symptoms started today. Yesterday she started triple therapy for H. pylori. She also reports some streaks of blood in her vomit which developed after a few episodes of emesis and blood in her stool. Blood in vomit likely secondary to Mallory-Weiss tear due to forceful vomiting. Her stool is light brown in color and heme negative.  I think her symptoms are likely secondary to  medication adverse effect, most likely the clarithromycin. Advised to discontinue this, discuss with her PCP regarding further H. pylori treatment, and continuing the omeprazole. Patient feels better after GI cocktail and this in agreement with the plan. Return precautions given.    Candyce ChurnJohn David Lounell Schumacher III, MD 02/11/14 [id number]

## 2014-02-11 NOTE — Discharge Instructions (Signed)

## 2014-02-11 NOTE — ED Notes (Signed)
Pt alert and oriented x4. Respirations even and unlabored, bilateral symmetrical rise and fall of chest. Skin warm and dry. In no acute distress. Denies needs.   

## 2014-02-11 NOTE — ED Notes (Signed)
Patient diagnosed with an ulcer one week ago.  Started on new medications yesterday, wellbutrin, prilosec, and an antibiotic.  Since then is having vomiting with small amount of blood, dark stools with blood also.  Also having left upper abdominal pain.

## 2014-02-13 ENCOUNTER — Encounter (HOSPITAL_COMMUNITY): Payer: Self-pay | Admitting: *Deleted

## 2014-02-13 ENCOUNTER — Ambulatory Visit (HOSPITAL_COMMUNITY)
Admission: RE | Admit: 2014-02-13 | Discharge: 2014-02-13 | Disposition: A | Discharge status: Home / Self Care | Payer: Managed Care, Other (non HMO) | Attending: Psychiatry | Admitting: Psychiatry

## 2014-02-13 DIAGNOSIS — T148XXA Other injury of unspecified body region, initial encounter: Secondary | ICD-10-CM

## 2014-02-13 DIAGNOSIS — F1110 Opioid abuse, uncomplicated: Secondary | ICD-10-CM | POA: Insufficient documentation

## 2014-02-13 DIAGNOSIS — F331 Major depressive disorder, recurrent, moderate: Secondary | ICD-10-CM | POA: Insufficient documentation

## 2014-02-13 DIAGNOSIS — F17200 Nicotine dependence, unspecified, uncomplicated: Secondary | ICD-10-CM | POA: Insufficient documentation

## 2014-02-13 DIAGNOSIS — F4000 Agoraphobia, unspecified: Secondary | ICD-10-CM | POA: Insufficient documentation

## 2014-02-13 HISTORY — DX: Other injury of unspecified body region, initial encounter: T14.8XXA
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
## 2014-02-20 ENCOUNTER — Other Ambulatory Visit (HOSPITAL_COMMUNITY): Payer: 59

## 2014-02-20 ENCOUNTER — Encounter (HOSPITAL_COMMUNITY): Payer: Self-pay
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
## 2014-02-21 ENCOUNTER — Other Ambulatory Visit (HOSPITAL_COMMUNITY): Payer: 59

## 2014-02-21 ENCOUNTER — Other Ambulatory Visit (HOSPITAL_COMMUNITY): Payer: 59 | Attending: Psychiatry | Admitting: Psychiatry

## 2014-02-21 DIAGNOSIS — F332 Major depressive disorder, recurrent severe without psychotic features: Secondary | ICD-10-CM | POA: Insufficient documentation

## 2014-02-21 DIAGNOSIS — G4700 Insomnia, unspecified: Secondary | ICD-10-CM | POA: Insufficient documentation

## 2014-02-21 DIAGNOSIS — F4310 Post-traumatic stress disorder, unspecified: Secondary | ICD-10-CM | POA: Diagnosis not present

## 2014-02-21 DIAGNOSIS — Z598 Other problems related to housing and economic circumstances: Secondary | ICD-10-CM | POA: Diagnosis not present

## 2014-02-21 DIAGNOSIS — Z5987 Material hardship due to limited financial resources, not elsewhere classified: Secondary | ICD-10-CM | POA: Insufficient documentation

## 2014-02-21 DIAGNOSIS — F411 Generalized anxiety disorder: Secondary | ICD-10-CM | POA: Diagnosis not present

## 2014-02-21 DIAGNOSIS — F17200 Nicotine dependence, unspecified, uncomplicated: Secondary | ICD-10-CM | POA: Diagnosis not present

## 2014-02-21 DIAGNOSIS — Z609 Problem related to social environment, unspecified: Secondary | ICD-10-CM | POA: Insufficient documentation

## 2014-02-22 ENCOUNTER — Encounter (HOSPITAL_COMMUNITY): Payer: Self-pay | Admitting: Psychiatry

## 2014-02-22 ENCOUNTER — Other Ambulatory Visit (HOSPITAL_COMMUNITY): Payer: 59 | Admitting: Psychiatry

## 2014-02-22 DIAGNOSIS — F332 Major depressive disorder, recurrent severe without psychotic features: Secondary | ICD-10-CM | POA: Diagnosis not present

## 2014-02-22 DIAGNOSIS — F4310 Post-traumatic stress disorder, unspecified: Secondary | ICD-10-CM
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
## 2014-02-22 NOTE — Progress Notes (Signed)
    Daily Group Progress Note  Program: IOP  Group Time: 9:00-10:30  Participation Level: Active  Behavioral Response: Appropriate  Type of Therapy:  Group Therapy  Summary of Progress: Pt. Met with case manager and psychiatrist.     Group Time: 10:30-12:00  Participation Level:  Active  Behavioral Response: Appropriate  Type of Therapy: Psycho-education Group  Summary of Progress: Pt. Met with case manager and psychiatrist.  [facility]

## 2014-02-23 ENCOUNTER — Telehealth (HOSPITAL_COMMUNITY): Payer: Self-pay | Admitting: Psychiatry

## 2014-02-23 ENCOUNTER — Other Ambulatory Visit (HOSPITAL_COMMUNITY): Payer: 59
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
## 2014-02-26 ENCOUNTER — Emergency Department (HOSPITAL_COMMUNITY): Payer: Managed Care, Other (non HMO)

## 2014-02-26 ENCOUNTER — Other Ambulatory Visit (HOSPITAL_COMMUNITY): Payer: 59 | Admitting: Psychiatry

## 2014-02-26 ENCOUNTER — Emergency Department (HOSPITAL_COMMUNITY)
Admission: EM | Admit: 2014-02-26 | Discharge: 2014-02-26 | Disposition: A | Discharge status: Home / Self Care | Payer: Managed Care, Other (non HMO) | Attending: Emergency Medicine | Admitting: Emergency Medicine

## 2014-02-26 ENCOUNTER — Encounter (HOSPITAL_COMMUNITY): Payer: Self-pay | Admitting: Emergency Medicine

## 2014-02-26 DIAGNOSIS — R45851 Suicidal ideations: Secondary | ICD-10-CM | POA: Insufficient documentation

## 2014-02-26 DIAGNOSIS — Z79899 Other long term (current) drug therapy: Secondary | ICD-10-CM | POA: Diagnosis not present

## 2014-02-26 DIAGNOSIS — F17200 Nicotine dependence, unspecified, uncomplicated: Secondary | ICD-10-CM | POA: Insufficient documentation

## 2014-02-26 DIAGNOSIS — F1310 Sedative, hypnotic or anxiolytic abuse, uncomplicated: Secondary | ICD-10-CM | POA: Diagnosis not present

## 2014-02-26 DIAGNOSIS — Z3202 Encounter for pregnancy test, result negative: Secondary | ICD-10-CM | POA: Diagnosis not present

## 2014-02-26 DIAGNOSIS — X789XXA Intentional self-harm by unspecified sharp object, initial encounter: Secondary | ICD-10-CM | POA: Diagnosis not present

## 2014-02-26 DIAGNOSIS — F1410 Cocaine abuse, uncomplicated: Secondary | ICD-10-CM | POA: Diagnosis not present

## 2014-02-26 DIAGNOSIS — Z8781 Personal history of (healed) traumatic fracture: Secondary | ICD-10-CM | POA: Diagnosis not present

## 2014-02-26 DIAGNOSIS — Z872 Personal history of diseases of the skin and subcutaneous tissue: Secondary | ICD-10-CM | POA: Diagnosis not present

## 2014-02-26 DIAGNOSIS — F1210 Cannabis abuse, uncomplicated: Secondary | ICD-10-CM | POA: Insufficient documentation

## 2014-02-26 DIAGNOSIS — F1910 Other psychoactive substance abuse, uncomplicated: Secondary | ICD-10-CM | POA: Diagnosis present

## 2014-02-26 DIAGNOSIS — Z008 Encounter for other general examination: Secondary | ICD-10-CM | POA: Diagnosis present

## 2014-02-26 DIAGNOSIS — S51809A Unspecified open wound of unspecified forearm, initial encounter: Secondary | ICD-10-CM | POA: Diagnosis not present

## 2014-02-26 DIAGNOSIS — X838XXA Intentional self-harm by other specified means, initial encounter: Secondary | ICD-10-CM

## 2014-02-26 DIAGNOSIS — F32A Depression, unspecified: Secondary | ICD-10-CM | POA: Diagnosis present

## 2014-02-26 DIAGNOSIS — F329 Major depressive disorder, single episode, unspecified: Secondary | ICD-10-CM | POA: Diagnosis present

## 2014-02-26 DIAGNOSIS — F39 Unspecified mood [affective] disorder: Secondary | ICD-10-CM

## 2014-02-26 LAB — RAPID URINE DRUG SCREEN, HOSP PERFORMED
AMPHETAMINES: NOT DETECTED
BENZODIAZEPINES: POSITIVE — AB
Barbiturates: POSITIVE — AB
Cocaine: POSITIVE — AB
OPIATES: NOT DETECTED
Tetrahydrocannabinol: POSITIVE — AB

## 2014-02-26 LAB — COMPREHENSIVE METABOLIC PANEL
ALT: 27 U/L (ref 0–35)
AST: 26 U/L (ref 0–37)
Albumin: 4.1 g/dL (ref 3.5–5.2)
Alkaline Phosphatase: 144 U/L — ABNORMAL HIGH (ref 39–117)
Anion gap: 13 (ref 5–15)
BUN: 9 mg/dL (ref 6–23)
CO2: 22 meq/L (ref 19–32)
Calcium: 10 mg/dL (ref 8.4–10.5)
Chloride: 108 mEq/L (ref 96–112)
Creatinine, Ser: 0.93 mg/dL (ref 0.50–1.10)
GFR calc Af Amer: >90 mL/min (ref >90)
GFR, EST NON AFRICAN AMERICAN: 89 mL/min — AB (ref >90)
Glucose, Bld: 102 mg/dL — ABNORMAL HIGH (ref 70–99)
Potassium: 4.2 mEq/L (ref 3.7–5.3)
SODIUM: 143 meq/L (ref 137–147)
TOTAL PROTEIN: 7.5 g/dL (ref 6.0–8.3)
Total Bilirubin: 0.6 mg/dL (ref 0.3–1.2)

## 2014-02-26 LAB — CBC
HCT: 43.8 % (ref 36.0–46.0)
Hemoglobin: 15 g/dL (ref 12.0–15.0)
MCH: 30.5 pg (ref 26.0–34.0)
MCHC: 34.2 g/dL (ref 30.0–36.0)
MCV: 89 fL (ref 78.0–100.0)
PLATELETS: 313 10*3/uL (ref 150–400)
RBC: 4.92 MIL/uL (ref 3.87–5.11)
RDW: 14.4 % (ref 11.5–15.5)
WBC: 18.5 10*3/uL — ABNORMAL HIGH (ref 4.0–10.5)

## 2014-02-26 LAB — ETHANOL: Alcohol, Ethyl (B): <11 mg/dL (ref 0–11)

## 2014-02-26 LAB — POC URINE PREG, ED: Preg Test, Ur: NEGATIVE

## 2014-02-26 LAB — ACETAMINOPHEN LEVEL

## 2014-02-26 LAB — SALICYLATE LEVEL: Salicylate Lvl: <2 mg/dL — ABNORMAL LOW (ref 2.8–20.0)

## 2014-02-26 MED ORDER — HYDROXYZINE HCL 25 MG PO TABS
25.0000 mg | ORAL_TABLET | Freq: Four times a day (QID) | ORAL | Status: DC | PRN
  Administered 2014-02-26: 25 mg via ORAL
  Filled 2014-02-26: qty 1

## 2014-02-26 MED ORDER — AZITHROMYCIN 250 MG PO TABS: 250.0000 mg | ORAL_TABLET | Freq: Every day | ORAL | Status: DC

## 2014-02-26 MED ORDER — BACITRACIN 500 UNIT/GM EX OINT
1.0000 "application " | TOPICAL_OINTMENT | Freq: Every day | CUTANEOUS | Status: DC
  Administered 2014-02-26: 1 via TOPICAL
  Filled 2014-02-26: qty 0.9

## 2014-02-26 NOTE — ED Notes (Signed)
Pt. Had one belonging bag and family took belongings home with them.

## 2014-02-26 NOTE — ED Notes (Signed)
Patient denys illicit drug use.  C/O anxiety.  PRN medications requested and received.
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
## 2014-02-26 NOTE — ED Notes (Signed)
Bed: WBH38 Expected date:  Expected time:  Means of arrival:  Comments: Triage 3 

## 2014-02-27 ENCOUNTER — Other Ambulatory Visit (HOSPITAL_COMMUNITY): Payer: 59 | Admitting: Psychiatry

## 2014-02-28 ENCOUNTER — Other Ambulatory Visit (HOSPITAL_COMMUNITY): Payer: 59

## 2014-02-28 NOTE — ED Provider Notes (Signed)
Medical screening examination/treatment/procedure(s) were conducted as a shared visit with non-physician practitioner(s) and myself.  I personally evaluated the patient during the encounter.   EKG Interpretation None       Doug SouSam Wilma Wuthrich, MD 02/28/14 [phone]

## 2014-03-01 ENCOUNTER — Other Ambulatory Visit (HOSPITAL_COMMUNITY): Payer: 59 | Admitting: Psychiatry

## 2014-03-02 ENCOUNTER — Other Ambulatory Visit (HOSPITAL_COMMUNITY): Payer: 59 | Admitting: Psychiatry

## 2014-03-04 NOTE — Consult Note (Signed)
Case discussed, agree with plan 

## 2014-03-05 ENCOUNTER — Other Ambulatory Visit (HOSPITAL_COMMUNITY): Payer: 59
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
## 2014-03-05 NOTE — Patient Instructions (Signed)
Patient will be discharged today due to noncompliancy with attendance.  F/U with Dr. Michae KavaAgarwal on 03-22-14 @ 2pm.

## 2014-03-06 ENCOUNTER — Other Ambulatory Visit (HOSPITAL_COMMUNITY): Payer: 59

## 2014-03-07 ENCOUNTER — Other Ambulatory Visit (HOSPITAL_COMMUNITY): Payer: 59

## 2014-03-08 ENCOUNTER — Other Ambulatory Visit (HOSPITAL_COMMUNITY): Payer: 59

## 2014-03-09 ENCOUNTER — Other Ambulatory Visit (HOSPITAL_COMMUNITY): Payer: 59

## 2014-03-12 ENCOUNTER — Other Ambulatory Visit (HOSPITAL_COMMUNITY): Payer: 59

## 2014-03-13 ENCOUNTER — Other Ambulatory Visit (HOSPITAL_COMMUNITY): Payer: 59

## 2014-03-14 ENCOUNTER — Other Ambulatory Visit (HOSPITAL_COMMUNITY): Payer: 59

## 2014-03-15 ENCOUNTER — Other Ambulatory Visit (HOSPITAL_COMMUNITY): Payer: 59

## 2014-03-16 ENCOUNTER — Other Ambulatory Visit (HOSPITAL_COMMUNITY): Payer: 59

## 2014-03-19 ENCOUNTER — Other Ambulatory Visit (HOSPITAL_COMMUNITY): Payer: 59

## 2014-03-20 ENCOUNTER — Other Ambulatory Visit (HOSPITAL_COMMUNITY): Payer: 59

## 2014-03-21 ENCOUNTER — Other Ambulatory Visit (HOSPITAL_COMMUNITY): Payer: 59

## 2014-03-22 ENCOUNTER — Other Ambulatory Visit (HOSPITAL_COMMUNITY): Payer: 59

## 2014-03-22 ENCOUNTER — Ambulatory Visit (HOSPITAL_COMMUNITY): Payer: Self-pay | Admitting: Psychiatry

## 2014-03-23 ENCOUNTER — Other Ambulatory Visit (HOSPITAL_COMMUNITY): Payer: 59

## 2014-03-26 ENCOUNTER — Other Ambulatory Visit (HOSPITAL_COMMUNITY): Payer: 59

## 2014-03-27 ENCOUNTER — Other Ambulatory Visit (HOSPITAL_COMMUNITY): Payer: 59

## 2014-03-28 ENCOUNTER — Other Ambulatory Visit (HOSPITAL_COMMUNITY): Payer: 59

## 2014-03-29 ENCOUNTER — Other Ambulatory Visit (HOSPITAL_COMMUNITY): Payer: 59

## 2014-03-30 ENCOUNTER — Other Ambulatory Visit (HOSPITAL_COMMUNITY): Payer: 59

## 2014-04-02 ENCOUNTER — Other Ambulatory Visit (HOSPITAL_COMMUNITY): Payer: 59

## 2014-06-11 ENCOUNTER — Encounter (HOSPITAL_COMMUNITY): Payer: Self-pay | Admitting: Emergency Medicine

## 2014-11-25 ENCOUNTER — Emergency Department (INDEPENDENT_AMBULATORY_CARE_PROVIDER_SITE_OTHER)
Admission: EM | Admit: 2014-11-25 | Discharge: 2014-11-25 | Disposition: A | Discharge status: Home / Self Care | Payer: Managed Care, Other (non HMO) | Source: Home / Self Care | Attending: Family Medicine | Admitting: Family Medicine

## 2014-11-25 ENCOUNTER — Encounter (HOSPITAL_COMMUNITY): Payer: Self-pay

## 2014-11-25 DIAGNOSIS — S39012A Strain of muscle, fascia and tendon of lower back, initial encounter: Secondary | ICD-10-CM | POA: Diagnosis not present

## 2014-11-25 LAB — POCT PREGNANCY, URINE: PREG TEST UR: NEGATIVE

## 2014-11-25 MED ORDER — DICLOFENAC SODIUM 1 % TD GEL: 1.0000 "application " | Freq: Four times a day (QID) | TRANSDERMAL | Status: DC

## 2014-11-25 MED ORDER — KETOROLAC TROMETHAMINE 60 MG/2ML IM SOLN
INTRAMUSCULAR | Status: AC
  Filled 2014-11-25: qty 2

## 2014-11-25 MED ORDER — TRAMADOL-ACETAMINOPHEN 37.5-325 MG PO TABS: 1.0000 | ORAL_TABLET | Freq: Four times a day (QID) | ORAL | Status: DC | PRN

## 2014-11-25 MED ORDER — KETOROLAC TROMETHAMINE 60 MG/2ML IM SOLN: 60.0000 mg | Freq: Once | INTRAMUSCULAR | Status: DC

## 2014-11-25 NOTE — Discharge Instructions (Signed)
Back Pain, Adult °Low back pain is very common. About 1 in 5 people have back pain. The cause of low back pain is rarely dangerous. The pain often gets better over time. About half of people with a sudden onset of back pain feel better in just 2 weeks. About 8 in 10 people feel better by 6 weeks.  °CAUSES °Some common causes of back pain include: °· Strain of the muscles or ligaments supporting the spine. °· Wear and tear (degeneration) of the spinal discs. °· Arthritis. °· Direct injury to the back. °DIAGNOSIS °Most of the time, the direct cause of low back pain is not known. However, back pain can be treated effectively even when the exact cause of the pain is unknown. Answering your caregiver's questions about your overall health and symptoms is one of the most accurate ways to make sure the cause of your pain is not dangerous. If your caregiver needs more information, he or she may order lab work or imaging tests (X-rays or MRIs). However, even if imaging tests show changes in your back, this usually does not require surgery. °HOME CARE INSTRUCTIONS °For many people, back pain returns. Since low back pain is rarely dangerous, it is often a condition that people can learn to manage on their own.  °· Remain active. It is stressful on the back to sit or stand in one place. Do not sit, drive, or stand in one place for more than 30 minutes at a time. Take short walks on level surfaces as soon as pain allows. Try to increase the length of time you walk each day. °· Do not stay in bed. Resting more than 1 or 2 days can delay your recovery. °· Do not avoid exercise or work. Your body is made to move. It is not dangerous to be active, even though your back may hurt. Your back will likely heal faster if you return to being active before your pain is gone. °· Pay attention to your body when you  bend and lift. Many people have less discomfort when lifting if they bend their knees, keep the load close to their bodies, and  avoid twisting. Often, the most comfortable positions are those that put less stress on your recovering back. °· Find a comfortable position to sleep. Use a firm mattress and lie on your side with your knees slightly bent. If you lie on your back, put a pillow under your knees. °· Only take over-the-counter or prescription medicines as directed by your caregiver. Over-the-counter medicines to reduce pain and inflammation are often the most helpful. Your caregiver may prescribe muscle relaxant drugs. These medicines help dull your pain so you can more quickly return to your normal activities and healthy exercise. °· Put ice on the injured area. °¨ Put ice in a plastic bag. °¨ Place a towel between your skin and the bag. °¨ Leave the ice on for 15-20 minutes, 03-04 times a day for the first 2 to 3 days. After that, ice and heat may be alternated to reduce pain and spasms. °· Ask your caregiver about trying back exercises and gentle massage. This may be of some benefit. °· Avoid feeling anxious or stressed. Stress increases muscle tension and can worsen back pain. It is important to recognize when you are anxious or stressed and learn ways to manage it. Exercise is a great option. °SEEK MEDICAL CARE IF: °· You have pain that is not relieved with rest or medicine. °· You have pain that does not improve in 1 week. °· You have new symptoms. °· You are generally not feeling well. °SEEK   IMMEDIATE MEDICAL CARE IF:  °· You have pain that radiates from your back into your legs. °· You develop new bowel or bladder control problems. °· You have unusual weakness or numbness in your arms or legs. °· You develop nausea or vomiting. °· You develop abdominal pain. °· You feel faint. °Document Released: 07/27/2005 Document Revised: 01/26/2012 Document Reviewed: 11/28/2013 °ExitCare® Patient Information ©2015 [employer]. This information is not intended to replace advice given to you by your health care provider. Make sure you  discuss any questions you have with your health care provider. ° °Lumbosacral Strain °Lumbosacral strain is a strain of any of the parts that make up your lumbosacral vertebrae. Your lumbosacral vertebrae are the bones that make up the lower third of your backbone. Your lumbosacral vertebrae are held together by muscles and tough, fibrous tissue (ligaments).  °CAUSES  °A sudden blow to your back can cause lumbosacral strain. Also, anything that causes an excessive stretch of the muscles in the low back can cause this strain. This is typically seen when people exert themselves strenuously, fall, lift heavy objects, bend, or crouch repeatedly. °RISK FACTORS °· Physically demanding work. °· Participation in pushing or pulling sports or sports that require a sudden twist of the back (tennis, golf, baseball). °· Weight lifting. °· Excessive lower back curvature. °· Forward-tilted pelvis. °· Weak back or abdominal muscles or both. °· Tight hamstrings. °SIGNS AND SYMPTOMS  °Lumbosacral strain may cause pain in the area of your injury or pain that moves (radiates) down your leg.  °DIAGNOSIS °Your health care provider can often diagnose lumbosacral strain through a physical exam. In some cases, you may need tests such as X-ray exams.  °TREATMENT  °Treatment for your lower back injury depends on many factors that your clinician will have to evaluate. However, most treatment will include the use of anti-inflammatory medicines. °HOME CARE INSTRUCTIONS  °· Avoid hard physical activities (tennis, racquetball, waterskiing) if you are not in proper physical condition for it. This may aggravate or create problems. °· If you have a back problem, avoid sports requiring sudden body movements. Swimming and walking are generally safer activities. °· Maintain good posture. °· Maintain a healthy weight. °· For acute conditions, you may put ice on the injured area. °¨ Put ice in a plastic bag. °¨ Place a towel between your skin and the  bag. °¨ Leave the ice on for 20 minutes, 2-3 times a day. °· When the low back starts healing, stretching and strengthening exercises may be recommended. °SEEK MEDICAL CARE IF: °· Your back pain is getting worse. °· You experience severe back pain not relieved with medicines. °SEEK IMMEDIATE MEDICAL CARE IF:  °· You have numbness, tingling, weakness, or problems with the use of your arms or legs. °· There is a change in bowel or bladder control. °· You have increasing pain in any area of the body, including your belly (abdomen). °· You notice shortness of breath, dizziness, or feel faint. °· You feel sick to your stomach (nauseous), are throwing up (vomiting), or become sweaty. °· You notice discoloration of your toes or legs, or your feet get very cold. °MAKE SURE YOU:  °· Understand these instructions. °· Will watch your condition. °· Will get help right away if you are not doing well or get worse. °Document Released: 05/06/2005 Document Revised: 08/01/2013 Document Reviewed: 03/15/2013 °ExitCare® Patient Information ©2015 [employer]. This information is not intended to replace advice given to you by your health care provider.   Make sure you discuss any questions you have with your health care provider. ° °Muscle Strain °A muscle strain is an injury that occurs when a muscle is stretched beyond its normal length. Usually a small number of muscle fibers are torn when this happens. Muscle strain is rated in degrees. First-degree strains have the least amount of muscle fiber tearing and pain. Second-degree and third-degree strains have increasingly more tearing and pain.  °Usually, recovery from muscle strain takes 1-2 weeks. Complete healing takes 5-6 weeks.  °CAUSES  °Muscle strain happens when a sudden, violent force placed on a muscle stretches it too far. This may occur with lifting, sports, or a fall.  °RISK FACTORS °Muscle strain is especially common in athletes.  °SIGNS AND SYMPTOMS °At the site of the  muscle strain, there may be: °· Pain. °· Bruising. °· Swelling. °· Difficulty using the muscle due to pain or lack of normal function. °DIAGNOSIS  °Your health care provider will perform a physical exam and ask about your medical history. °TREATMENT  °Often, the best treatment for a muscle strain is resting, icing, and applying cold compresses to the injured area.   °HOME CARE INSTRUCTIONS  °· Use the PRICE method of treatment to promote muscle healing during the first 2-3 days after your injury. The PRICE method involves: °¨ Protecting the muscle from being injured again. °¨ Restricting your activity and resting the injured body part. °¨ Icing your injury. To do this, put ice in a plastic bag. Place a towel between your skin and the bag. Then, apply the ice and leave it on from 15-20 minutes each hour. After the third day, switch to moist heat packs. °¨ Apply compression to the injured area with a splint or elastic bandage. Be careful not to wrap it too tightly. This may interfere with blood circulation or increase swelling. °¨ Elevate the injured body part above the level of your heart as often as you can. °· Only take over-the-counter or prescription medicines for pain, discomfort, or fever as directed by your health care provider. °· Warming up prior to exercise helps to prevent future muscle strains. °SEEK MEDICAL CARE IF:  °· You have increasing pain or swelling in the injured area. °· You have numbness, tingling, or a significant loss of strength in the injured area. °MAKE SURE YOU:  °· Understand these instructions. °· Will watch your condition. °· Will get help right away if you are not doing well or get worse. °Document Released: 07/27/2005 Document Revised: 05/17/2013 Document Reviewed: 02/23/2013 °ExitCare® Patient Information ©2015 [employer]. This information is not intended to replace advice given to you by your health care provider. Make sure you discuss any questions you have with your health  care provider. ° °

## 2014-11-25 NOTE — ED Notes (Signed)
States she was lifting 80 lb bag of concrete w co-worker OFF THE CLOCK, when she felt her back "crack". C/o since then she has had a lot of pain in her back. Reported history of fracture in back (unsure where) LMP 3 months ago, implanted BCP method

## 2014-11-25 NOTE — ED Provider Notes (Signed)
CSN: [id number]     Arrival date & time 11/25/14  [id number] History   First MD Initiated Contact with Patient 11/25/14 1000     Chief Complaint  Patient presents with  . Back Pain   (Consider location/radiation/quality/duration/timing/severity/associated sxs/prior Treatment) HPI Comments: 19 year old female states that she was assisting another person lifting heavy bag of concrete last night. She felt an acute pain to the right lower back and the para sacral musculature. She denies focal paresthesias or weakness. Pain is worse when leaning forward or lateral movement of the spine.   Past Medical History  Diagnosis Date  . Medical history non-contributory   . Ulcer   . Depression   . Fracture 02/13/2014    Back, 5 years ago.  Marland Kitchen. Anxiety   . PTSD (post-traumatic stress disorder)    Past Surgical History  Procedure Laterality Date  . Tonsillectomy    . Adnoids     Family History  Problem Relation Age of Onset  . Arthritis Neg Hx   . Asthma Neg Hx   . Birth defects Neg Hx   . Cancer Neg Hx   . COPD Neg Hx   . Diabetes Neg Hx   . Early death Neg Hx   . Hearing loss Neg Hx   . Heart disease Neg Hx   . Hypertension Neg Hx   . Kidney disease Neg Hx   . Hyperlipidemia Neg Hx   . Learning disabilities Neg Hx   . Mental illness Neg Hx   . Mental retardation Neg Hx   . Miscarriages / Stillbirths Neg Hx   . Stroke Neg Hx   . Vision loss Neg Hx   . Alcohol abuse Father   . Anxiety disorder Father   . Bipolar disorder Father   . Depression Father   . Drug abuse Father    History  Substance Use Topics  . Smoking status: Current Every Day Smoker -- 1.00 packs/day    Types: Cigarettes    Last Attempt to Quit: 03/12/2013  . Smokeless tobacco: Never Used  . Alcohol Use: No   OB History    Gravida Para Term Preterm AB TAB SAB Ectopic Multiple Living   2 2 2       2      Review of Systems  Constitutional: Negative.   Respiratory: Negative.   Genitourinary: Negative.    Musculoskeletal: Positive for back pain. Negative for gait problem, neck pain and neck stiffness.  Skin: Negative.   Neurological: Negative.  Negative for dizziness, tremors, syncope, weakness and numbness.    Allergies  Review of patient's allergies indicates no known allergies.  Home Medications   Prior to Admission medications   Medication Sig Start Date End Date Taking? Authorizing Provider  diclofenac sodium (VOLTAREN) 1 % GEL Apply 1 application topically 4 (four) times daily. 11/25/14   Hayden Rasmussenavid Azka Steger, NP  etonogestrel (NEXPLANON) 68 MG IMPL implant Inject 68 mg into the skin once. 02/05/14   Historical Provider, MD  traMADol-acetaminophen (ULTRACET) 37.5-325 MG per tablet Take 1 tablet by mouth every 6 (six) hours as needed. 11/25/14   Hayden Rasmussenavid Michial Disney, NP   BP 97/61 mmHg  Pulse 90  Temp(Src) 97.8 F (36.6 C) (Oral)  Resp 16  SpO2 99% Physical Exam  Constitutional: She is oriented to person, place, and time. She appears well-developed and well-nourished. No distress.  Neck: Normal range of motion.  Pulmonary/Chest: Effort normal. No respiratory distress.  Musculoskeletal: She exhibits no edema.  Tenderness to the musculature  of the right para sacral musculature. No spinal tenderness or palpable or observed deformities. No discoloration.  Neurological: She is alert and oriented to person, place, and time.  Skin: Skin is warm and dry.  Psychiatric: She has a normal mood and affect.  Nursing note and vitals reviewed.   ED Course  Procedures (including critical care time) Labs Review Labs Reviewed  POCT PREGNANCY, URINE    Imaging Review No results found.   MDM   1. Low back strain, initial encounter    Heat diclfenac gel ultracet #15 stretches    Hayden Rasmussen, NP 11/25/14 1056

## 2014-12-09 ENCOUNTER — Emergency Department (INDEPENDENT_AMBULATORY_CARE_PROVIDER_SITE_OTHER)
Admission: EM | Admit: 2014-12-09 | Discharge: 2014-12-09 | Disposition: A | Discharge status: Nursing Home (Includes ICF) | Payer: Managed Care, Other (non HMO) | Source: Home / Self Care | Attending: Family Medicine | Admitting: Family Medicine

## 2014-12-09 ENCOUNTER — Emergency Department (HOSPITAL_COMMUNITY)
Admission: EM | Admit: 2014-12-09 | Discharge: 2014-12-09 | Disposition: A | Discharge status: Home / Self Care | Payer: Managed Care, Other (non HMO) | Attending: Emergency Medicine | Admitting: Emergency Medicine

## 2014-12-09 ENCOUNTER — Encounter (HOSPITAL_COMMUNITY): Payer: Self-pay | Admitting: Emergency Medicine

## 2014-12-09 DIAGNOSIS — R1032 Left lower quadrant pain: Secondary | ICD-10-CM | POA: Insufficient documentation

## 2014-12-09 DIAGNOSIS — R1031 Right lower quadrant pain: Secondary | ICD-10-CM | POA: Insufficient documentation

## 2014-12-09 DIAGNOSIS — Z8659 Personal history of other mental and behavioral disorders: Secondary | ICD-10-CM | POA: Diagnosis not present

## 2014-12-09 DIAGNOSIS — Z791 Long term (current) use of non-steroidal anti-inflammatories (NSAID): Secondary | ICD-10-CM | POA: Diagnosis not present

## 2014-12-09 DIAGNOSIS — Z8781 Personal history of (healed) traumatic fracture: Secondary | ICD-10-CM | POA: Insufficient documentation

## 2014-12-09 DIAGNOSIS — Z720 Tobacco use: Secondary | ICD-10-CM | POA: Diagnosis not present

## 2014-12-09 DIAGNOSIS — Z3202 Encounter for pregnancy test, result negative: Secondary | ICD-10-CM | POA: Diagnosis not present

## 2014-12-09 DIAGNOSIS — R110 Nausea: Secondary | ICD-10-CM | POA: Diagnosis not present

## 2014-12-09 DIAGNOSIS — R109 Unspecified abdominal pain: Secondary | ICD-10-CM

## 2014-12-09 DIAGNOSIS — Z79899 Other long term (current) drug therapy: Secondary | ICD-10-CM | POA: Insufficient documentation

## 2014-12-09 DIAGNOSIS — R197 Diarrhea, unspecified: Secondary | ICD-10-CM | POA: Diagnosis not present

## 2014-12-09 LAB — POC URINE PREG, ED: Preg Test, Ur: NEGATIVE

## 2014-12-09 LAB — COMPREHENSIVE METABOLIC PANEL
ALT: 22 U/L (ref 14–54)
ANION GAP: 8 (ref 5–15)
AST: 24 U/L (ref 15–41)
Albumin: 3.7 g/dL (ref 3.5–5.0)
Alkaline Phosphatase: 88 U/L (ref 38–126)
BUN: 6 mg/dL (ref 6–20)
CALCIUM: 9.1 mg/dL (ref 8.9–10.3)
CO2: 25 mmol/L (ref 22–32)
CREATININE: 0.8 mg/dL (ref 0.44–1.00)
Chloride: 107 mmol/L (ref 101–111)
GLUCOSE: 89 mg/dL (ref 70–99)
Potassium: 3.6 mmol/L (ref 3.5–5.1)
Sodium: 140 mmol/L (ref 135–145)
Total Bilirubin: 0.7 mg/dL (ref 0.3–1.2)
Total Protein: 6.5 g/dL (ref 6.5–8.1)

## 2014-12-09 LAB — CBC WITH DIFFERENTIAL/PLATELET
Basophils Absolute: 0 K/uL (ref 0.0–0.1)
Basophils Relative: 0 % (ref 0–1)
Eosinophils Absolute: 0.2 K/uL (ref 0.0–0.7)
Eosinophils Relative: 3 % (ref 0–5)
HCT: 41.8 % (ref 36.0–46.0)
Hemoglobin: 14.3 g/dL (ref 12.0–15.0)
Lymphocytes Relative: 32 % (ref 12–46)
Lymphs Abs: 2.6 K/uL (ref 0.7–4.0)
MCH: 31.6 pg (ref 26.0–34.0)
MCHC: 34.2 g/dL (ref 30.0–36.0)
MCV: 92.5 fL (ref 78.0–100.0)
Monocytes Absolute: 0.6 K/uL (ref 0.1–1.0)
Monocytes Relative: 7 % (ref 3–12)
Neutro Abs: 4.7 K/uL (ref 1.7–7.7)
Neutrophils Relative %: 58 % (ref 43–77)
Platelets: 210 K/uL (ref 150–400)
RBC: 4.52 MIL/uL (ref 3.87–5.11)
RDW: 13 % (ref 11.5–15.5)
WBC: 8.1 K/uL (ref 4.0–10.5)

## 2014-12-09 LAB — URINALYSIS, ROUTINE W REFLEX MICROSCOPIC
BILIRUBIN URINE: NEGATIVE
Glucose, UA: NEGATIVE mg/dL
Hgb urine dipstick: NEGATIVE
KETONES UR: NEGATIVE mg/dL
Leukocytes, UA: NEGATIVE
NITRITE: NEGATIVE
Protein, ur: NEGATIVE mg/dL
Specific Gravity, Urine: 1.013 (ref 1.005–1.030)
UROBILINOGEN UA: 0.2 mg/dL (ref 0.0–1.0)
pH: 7.5 (ref 5.0–8.0)

## 2014-12-09 MED ORDER — ONDANSETRON HCL 4 MG PO TABS: 4.0000 mg | ORAL_TABLET | Freq: Four times a day (QID) | ORAL | Status: DC

## 2014-12-09 MED ORDER — GI COCKTAIL ~~LOC~~
30.0000 mL | Freq: Once | ORAL | Status: AC
  Administered 2014-12-09: 30 mL via ORAL
  Filled 2014-12-09: qty 30

## 2014-12-09 MED ORDER — OMEPRAZOLE 20 MG PO CPDR: 20.0000 mg | DELAYED_RELEASE_CAPSULE | Freq: Every day | ORAL | Status: DC

## 2014-12-09 MED ORDER — SODIUM CHLORIDE 0.9 % IV BOLUS (SEPSIS)
1000.0000 mL | Freq: Once | INTRAVENOUS | Status: AC
  Administered 2014-12-09: 1000 mL via INTRAVENOUS

## 2014-12-09 NOTE — Discharge Instructions (Signed)
Abdominal Pain, Women °Abdominal (stomach, pelvic, or belly) pain can be caused by many things. It is important to tell your doctor: °· The location of the pain. °· Does it come and go or is it present all the time? °· Are there things that start the pain (eating certain foods, exercise)? °· Are there other symptoms associated with the pain (fever, nausea, vomiting, diarrhea)? °All of this is helpful to know when trying to find the cause of the pain. °CAUSES  °· Stomach: virus or bacteria infection, or ulcer. °· Intestine: appendicitis (inflamed appendix), regional ileitis (Crohn's disease), ulcerative colitis (inflamed colon), irritable bowel syndrome, diverticulitis (inflamed diverticulum of the colon), or cancer of the stomach or intestine. °· Gallbladder disease or stones in the gallbladder. °· Kidney disease, kidney stones, or infection. °· Pancreas infection or cancer. °· Fibromyalgia (pain disorder). °· Diseases of the female organs: °¨ Uterus: fibroid (non-cancerous) tumors or infection. °¨ Fallopian tubes: infection or tubal pregnancy. °¨ Ovary: cysts or tumors. °¨ Pelvic adhesions (scar tissue). °¨ Endometriosis (uterus lining tissue growing in the pelvis and on the pelvic organs). °¨ Pelvic congestion syndrome (female organs filling up with blood just before the menstrual period). °¨ Pain with the menstrual period. °¨ Pain with ovulation (producing an egg). °¨ Pain with an IUD (intrauterine device, birth control) in the uterus. °¨ Cancer of the female organs. °· Functional pain (pain not caused by a disease, may improve without treatment). °· Psychological pain. °· Depression. °DIAGNOSIS  °Your doctor will decide the seriousness of your pain by doing an examination. °· Blood tests. °· X-rays. °· Ultrasound. °· CT scan (computed tomography, special type of X-ray). °· MRI (magnetic resonance imaging). °· Cultures, for infection. °· Barium enema (dye inserted in the large intestine, to better view it with  X-rays). °· Colonoscopy (looking in intestine with a lighted tube). °· Laparoscopy (minor surgery, looking in abdomen with a lighted tube). °· Major abdominal exploratory surgery (looking in abdomen with a large incision). °TREATMENT  °The treatment will depend on the cause of the pain.  °· Many cases can be observed and treated at home. °· Over-the-counter medicines recommended by your caregiver. °· Prescription medicine. °· Antibiotics, for infection. °· Birth control pills, for painful periods or for ovulation pain. °· Hormone treatment, for endometriosis. °· Nerve blocking injections. °· Physical therapy. °· Antidepressants. °· Counseling with a psychologist or psychiatrist. °· Minor or major surgery. °HOME CARE INSTRUCTIONS  °· Do not take laxatives, unless directed by your caregiver. °· Take over-the-counter pain medicine only if ordered by your caregiver. Do not take aspirin because it can cause an upset stomach or bleeding. °· Try a clear liquid diet (broth or water) as ordered by your caregiver. Slowly move to a bland diet, as tolerated, if the pain is related to the stomach or intestine. °· Have a thermometer and take your temperature several times a day, and record it. °· Bed rest and sleep, if it helps the pain. °· Avoid sexual intercourse, if it causes pain. °· Avoid stressful situations. °· Keep your follow-up appointments and tests, as your caregiver orders. °· If the pain does not go away with medicine or surgery, you may try: °¨ Acupuncture. °¨ Relaxation exercises (yoga, meditation). °¨ Group therapy. °¨ Counseling. °SEEK MEDICAL CARE IF:  °· You notice certain foods cause stomach pain. °· Your home care treatment is not helping your pain. °· You need stronger pain medicine. °· You want your IUD removed. °· You feel faint or   lightheaded.  You develop nausea and vomiting.  You develop a rash.  You are having side effects or an allergy to your medicine. SEEK IMMEDIATE MEDICAL CARE IF:   Your  pain does not go away or gets worse.  You have a fever.  Your pain is felt only in portions of the abdomen. The right side could possibly be appendicitis. The left lower portion of the abdomen could be colitis or diverticulitis.  You are passing blood in your stools (bright red or black tarry stools, with or without vomiting).  You have blood in your urine.  You develop chills, with or without a fever.  You pass out. MAKE SURE YOU:   Understand these instructions.  Will watch your condition.  Will get help right away if you are not doing well or get worse. Document Released: 05/24/2007 Document Revised: 12/11/2013 Document Reviewed: 06/13/2009 [facility] Patient Information 2015 [employer], [place]. This information is not intended to replace advice given to you by your health care provider. Make sure you discuss any questions you have with your health care provider.  You were evaluated in the ED today. Abdominal discomfort. There does not appear to be an emergent cause for your symptoms at this time. It is important she follow up with primary care for further evaluation and management of your symptoms. Return to ED for new or worsening symptoms. Please take your medications as prescribed.

## 2014-12-09 NOTE — ED Notes (Signed)
Pt c/o left sided abdominal pain with nausea x 3-4 days. Pt seen at urgent care and sent here for further eval.

## 2014-12-09 NOTE — ED Provider Notes (Signed)
CSN: [id number]     Arrival date & time 12/09/14  0913 History   First MD Initiated Contact with Patient 12/09/14 [phone]     Chief Complaint  Patient presents with  . Abdominal Pain   (Consider location/radiation/quality/duration/timing/severity/associated sxs/prior Treatment) Patient is a 19 y.o. female presenting with abdominal pain. The history is provided by the patient.  Abdominal Pain Pain location:  LUQ Pain quality: cramping   Pain radiates to:  Does not radiate Pain severity:  Moderate Onset quality:  Sudden Duration:  3 days Timing:  Constant Progression:  Worsening Chronicity:  New Context: not sick contacts and not suspicious food intake   Associated symptoms: chest pain, cough and diarrhea   Associated symptoms: no fever, no melena, no nausea and no vomiting   Associated symptoms comment:  4-5 x /day no blood, no fever, no n/v. Risk factors comment:  Told at [facility] health dx'd with cap 3 times this yr.   Past Medical History  Diagnosis Date  . Medical history non-contributory   . Ulcer   . Depression   . Fracture 02/13/2014    Back, 5 years ago.  Marland Kitchen Anxiety   . PTSD (post-traumatic stress disorder)    Past Surgical History  Procedure Laterality Date  . Tonsillectomy    . Adnoids     Family History  Problem Relation Age of Onset  . Arthritis Neg Hx   . Asthma Neg Hx   . Birth defects Neg Hx   . Cancer Neg Hx   . COPD Neg Hx   . Diabetes Neg Hx   . Early death Neg Hx   . Hearing loss Neg Hx   . Heart disease Neg Hx   . Hypertension Neg Hx   . Kidney disease Neg Hx   . Hyperlipidemia Neg Hx   . Learning disabilities Neg Hx   . Mental illness Neg Hx   . Mental retardation Neg Hx   . Miscarriages / Stillbirths Neg Hx   . Stroke Neg Hx   . Vision loss Neg Hx   . Alcohol abuse Father   . Anxiety disorder Father   . Bipolar disorder Father   . Depression Father   . Drug abuse Father    History  Substance Use Topics  . Smoking status: Current Every  Day Smoker -- 1.00 packs/day    Types: Cigarettes    Last Attempt to Quit: 03/12/2013  . Smokeless tobacco: Never Used  . Alcohol Use: No   OB History    Gravida Para Term Preterm AB TAB SAB Ectopic Multiple Living   Review of Systems  Constitutional: Negative.  Negative for fever.  Respiratory: Positive for cough.   Cardiovascular: Positive for chest pain.  Gastrointestinal: Positive for abdominal pain and diarrhea. Negative for nausea, vomiting and melena.  Genitourinary: Negative.     Allergies  Review of patient's allergies indicates no known allergies.  Home Medications   Prior to Admission medications   Medication Sig Start Date End Date Taking? Authorizing Provider  diclofenac sodium (VOLTAREN) 1 % GEL Apply 1 application topically 4 (four) times daily. 11/25/14   Hayden Rasmussen, NP  etonogestrel (NEXPLANON) 68 MG IMPL implant Inject 68 mg into the skin once. 02/05/14   Historical Provider, MD  traMADol-acetaminophen (ULTRACET) 37.5-325 MG per tablet Take 1 tablet by mouth every 6 (six) hours as needed. 11/25/14   Hayden Rasmussen, NP   BP  103/66 mmHg  Pulse 80  Temp(Src) 98.5 F (36.9 C) (Oral)  Resp 16  SpO2 98%  LMP 11/23/2014  Breastfeeding? No Physical Exam  Constitutional: She is oriented to person, place, and time. She appears well-developed and well-nourished.  Neck: Normal range of motion. Neck supple.  Cardiovascular: Regular rhythm and normal heart sounds.   Pulmonary/Chest: Effort normal and breath sounds normal.  Abdominal: Soft. Bowel sounds are normal. She exhibits no distension and no mass. There is tenderness in the left upper quadrant. There is no rigidity, no rebound and no guarding.    Lymphadenopathy:    She has no cervical adenopathy.  Neurological: She is alert and oriented to person, place, and time.  Skin: Skin is warm and dry.  Nursing note and vitals reviewed.   ED Course  Procedures (including critical care time) Labs  Review Labs Reviewed - No data to display  Imaging Review No results found.   MDM   1. Abdominal pain of unknown cause, left    H/o polysubstance abuse, sent for eval of luq abd pain with diarrhea , poss c diff.    Linna HoffJames D Emelly Wurtz, MD 12/09/14 1045

## 2014-12-09 NOTE — ED Provider Notes (Signed)
CSN: [id number]     Arrival date & time 12/09/14  1102 History   First MD Initiated Contact with Patient 12/09/14 1113     Chief Complaint  Patient presents with  . Abdominal Pain  . Nausea     (Consider location/radiation/quality/duration/timing/severity/associated sxs/prior Treatment) HPI Carrie Peterson is a 19 y.o. female who comes in via transfer from urgent care facility for evaluation of left-sided abdominal pain. Patient reports for the past 3 or 4 days she has had a constant left-sided abdominal discomfort that she characterizes as a cramping sensation. She also reports intermittent, colicky pain on her left side. At its worst, rates the discomfort as an 8/10. She reports associated nausea without vomiting. She also reports diarrhea, 2-3 loose stools per day, nonbloody. Denies any fevers, chills, urinary symptoms, vaginal bleeding or discharge, back pain, pelvic pain. Patient is on Nexplanon and last period was 3 months ago and was normal for her. Denies any new medications, antibiotics or hospitalizations.  Past Medical History  Diagnosis Date  . Medical history non-contributory   . Ulcer   . Depression   . Fracture 02/13/2014    Back, 5 years ago.  Marland Kitchen Anxiety   . PTSD (post-traumatic stress disorder)    Past Surgical History  Procedure Laterality Date  . Tonsillectomy    . Adnoids     Family History  Problem Relation Age of Onset  . Arthritis Neg Hx   . Asthma Neg Hx   . Birth defects Neg Hx   . Cancer Neg Hx   . COPD Neg Hx   . Diabetes Neg Hx   . Early death Neg Hx   . Hearing loss Neg Hx   . Heart disease Neg Hx   . Hypertension Neg Hx   . Kidney disease Neg Hx   . Hyperlipidemia Neg Hx   . Learning disabilities Neg Hx   . Mental illness Neg Hx   . Mental retardation Neg Hx   . Miscarriages / Stillbirths Neg Hx   . Stroke Neg Hx   . Vision loss Neg Hx   . Alcohol abuse Father   . Anxiety disorder Father   . Bipolar disorder Father   . Depression Father    . Drug abuse Father    History  Substance Use Topics  . Smoking status: Current Every Day Smoker -- 1.00 packs/day    Types: Cigarettes    Last Attempt to Quit: 03/12/2013  . Smokeless tobacco: Never Used  . Alcohol Use: No   OB History    Gravida Para Term Preterm AB TAB SAB Ectopic Multiple Living   Review of Systems A 10 point review of systems was completed and was negative except for pertinent positives and negatives as mentioned in the history of present illness     Allergies  Review of patient's allergies indicates no known allergies.  Home Medications   Prior to Admission medications   Medication Sig Start Date End Date Taking? Authorizing Provider  diclofenac sodium (VOLTAREN) 1 % GEL Apply 1 application topically 4 (four) times daily. 11/25/14   Hayden Rasmussen, NP  etonogestrel (NEXPLANON) 68 MG IMPL implant Inject 68 mg into the skin once. 02/05/14   Historical Provider, MD  omeprazole (PRILOSEC) 20 MG capsule Take 1 capsule (20 mg total) by mouth daily. 12/09/14   Joycie Peek, PA-C  ondansetron (ZOFRAN) 4 MG tablet Take 1 tablet (4 mg total)  by mouth every 6 (six) hours. 12/09/14   Joycie Peek, PA-C  traMADol-acetaminophen (ULTRACET) 37.5-325 MG per tablet Take 1 tablet by mouth every 6 (six) hours as needed. 11/25/14   Hayden Rasmussen, NP   BP 105/60 mmHg  Pulse 56  Temp(Src) 98 F (36.7 C) (Oral)  Resp 18  Ht  (1.676 m)  Wt 150 lb (68.04 kg)  BMI 24.22 kg/m2  SpO2 100%  LMP 08/24/2014  Breastfeeding? No Physical Exam  Constitutional: She is oriented to person, place, and time. She appears well-developed and well-nourished.  HENT:  Head: Normocephalic and atraumatic.  Mouth/Throat: Oropharynx is clear and moist.  Eyes: Conjunctivae are normal. Pupils are equal, round, and reactive to light. Right eye exhibits no discharge. Left eye exhibits no discharge. No scleral icterus.  Neck: Neck supple.  Cardiovascular: Normal rate, regular  rhythm and normal heart sounds.   Pulmonary/Chest: Effort normal and breath sounds normal. No respiratory distress. She has no wheezes. She has no rales.  Abdominal: Soft. There is no tenderness.  Tenderness to palpation in lower left quadrant. Mild discomfort in the right lower quadrant. Negative Murphy's. No organomegaly abdomen is soft, nondistended without rebound or guarding. No lesions or deformities or palpable masses.  Musculoskeletal: She exhibits no tenderness.  Neurological: She is alert and oriented to person, place, and time.  Cranial Nerves II-XII grossly intact  Skin: Skin is warm and dry. No rash noted.  Psychiatric: She has a normal mood and affect.  Nursing note and vitals reviewed.   ED Course  Procedures (including critical care time) Labs Review Labs Reviewed  CBC WITH DIFFERENTIAL/PLATELET  COMPREHENSIVE METABOLIC PANEL  URINALYSIS, ROUTINE W REFLEX MICROSCOPIC  POC URINE PREG, ED    Imaging Review No results found.   EKG Interpretation None     Meds given in ED:  Medications  sodium chloride 0.9 % bolus 1,000 mL (0 mLs Intravenous Stopped 12/09/14 1342)  gi cocktail (Maalox,Lidocaine,Donnatal) (30 mLs Oral Given 12/09/14 1314)    New Prescriptions   OMEPRAZOLE (PRILOSEC) 20 MG CAPSULE    Take 1 capsule (20 mg total) by mouth daily.   ONDANSETRON (ZOFRAN) 4 MG TABLET    Take 1 tablet (4 mg total) by mouth every 6 (six) hours.   Filed Vitals:   12/09/14 1159 12/09/14 1300  BP: 109/62 105/60  Pulse: 60 56  Temp: 98 F (36.7 C)   TempSrc: Oral   Resp: 18   Height:  (1.676 m)   Weight: 150 lb (68.04 kg)   SpO2: 100% 100%     MDM  Vitals stable, afebrile Feels better with GI cocktail administered in ED. Tolerating PO in ED Repeat abd exam benign, no abdominal tenderness. No Organomegaly.  Labs:  Pregnancy negative. Labs otherwise noncontributory.  Patient with nausea, diarrhea and abdominal discomfort likely secondary to gastroenteritis.  Feels much better after administration of GI cocktail in the ED. No evidence of Acute Appendicitis, Cholecystitis, Pancreatitis, Pyelonephritis, Cystitis, Diverticulitis, Acute infectious Colitis, SBO. No evidence of GI bleed. Doubt ectopic, torsion. Low suspicion for ischemia or other vascular compromise. Doubt Cardiovascular/Pulmonary etiology  I have personally reviewed all labs, imaging, nursing/previous notes during the patient's evaluation in the ED today. No evidence of other acute or emergent pathology that requires immediate intervention at this time. Pt stable, in good condition and is appropriate for discharge. DC with PPI, anti-emetic and instructions to follow up with PCP within 48 hrs for further evaluation and management of symptoms as well as  return precautions.   Final diagnoses:  Diarrhea  Nausea  Abdominal discomfort        Joycie PeekBenjamin Rumaisa Schnetzer, PA-C 12/09/14 1451  Rolan BuccoMelanie Belfi, MD 12/09/14 1547

## 2014-12-09 NOTE — ED Notes (Signed)
C/o left abdominal pain, cough( productive) diarrhea x 3-4 days. Per pt dx with ulcer x 1 yr ago. AUpright, RMA

## 2016-12-23 DIAGNOSIS — N6489 Other specified disorders of breast: Secondary | ICD-10-CM | POA: Insufficient documentation

## 2016-12-23 DIAGNOSIS — S62346A Nondisplaced fracture of base of fifth metacarpal bone, right hand, initial encounter for closed fracture: Secondary | ICD-10-CM

## 2016-12-23 DIAGNOSIS — Z3009 Encounter for other general counseling and advice on contraception: Secondary | ICD-10-CM | POA: Insufficient documentation

## 2016-12-23 DIAGNOSIS — Z124 Encounter for screening for malignant neoplasm of cervix: Secondary | ICD-10-CM

## 2016-12-23 DIAGNOSIS — R110 Nausea: Secondary | ICD-10-CM | POA: Insufficient documentation

## 2016-12-23 DIAGNOSIS — F329 Major depressive disorder, single episode, unspecified: Secondary | ICD-10-CM | POA: Insufficient documentation

## 2016-12-23 DIAGNOSIS — F419 Anxiety disorder, unspecified: Secondary | ICD-10-CM

## 2016-12-23 HISTORY — DX: Encounter for screening for malignant neoplasm of cervix: Z12.4

## 2016-12-23 HISTORY — DX: Nondisplaced fracture of base of fifth metacarpal bone, right hand, initial encounter for closed fracture: S62.346A

## 2016-12-27 ENCOUNTER — Emergency Department
Admission: EM | Admit: 2016-12-27 | Discharge: 2016-12-28 | Discharge status: Against Medical Advice | Payer: Self-pay | Attending: Emergency Medicine | Admitting: Emergency Medicine

## 2016-12-27 ENCOUNTER — Emergency Department: Payer: Self-pay

## 2016-12-27 DIAGNOSIS — Z5321 Procedure and treatment not carried out due to patient leaving prior to being seen by health care provider: Principal | ICD-10-CM | POA: Insufficient documentation

## 2016-12-27 NOTE — ED Nursing Note (Signed)
Pt here c/o rt arm pain. Pt states she had a splint on her arm but states she cut it off yesterday because it "got wet".  Pt able to verbalize all needs. NAD noted.  RR even, non-labored.

## 2016-12-27 NOTE — ED Initial Note (Signed)
EMERGENCY DEPARTMENT PHYSICIAN NOTE - Wendy Villegas       Date of Service:   12/27/2016 10:21 PM Patient's PCP: No primary care provider on file.   Note Started: 12/27/2016 23:39 DOB: January 12, 1996             Chief Complaint   Patient presents with    Orthopedics - Fracture Closed     The history provided by the patient.  Interpreter used: No    Wendy Villegas is a 21yr old female, who has a past medical history significant for nothing, presenting to the ED with a chief complaint of right hand fracture that began one week ago. Punched a door one week ago, diagnosed with right 5th metacarpal fracture. Splinted and advised to follow up with ortho. Yesterday splint got wet and she removed it.     Today has 7/10, achy, constant, non-radiating pain. Normal sensation     A full history, including past medical, social, and family history (as detailed in this note), was reviewed and updated as necessary.    HISTORY:  No past medical history on file. No Known Allergies   No past surgical history on file. No current outpatient prescriptions on file.   Social History    Marital status:                     Spouse name:                       Years of education:                 Number of children:               Occupational History    None on file    Social History Main Topics    Smoking status: Not on file                          Smokeless status: Not on file                       Alcohol use: Not on file     Drug use: Not on file     Sexual activity: Not on file          Other Topics            Concern    None on file    Social History Narrative    None on file     No family history on file.     Review of Systems   Constitutional: Negative for chills and fever.   HENT: Negative for sore throat.    Respiratory: Negative for cough and shortness of breath.    Cardiovascular: Negative for chest pain and leg swelling.   Gastrointestinal: Negative for abdominal pain, constipation, diarrhea, nausea and vomiting.   Genitourinary:  Negative for dysuria.   Musculoskeletal: Positive for joint swelling. Negative for neck pain.   Skin: Negative for rash.   Neurological: Negative for dizziness, syncope and light-headedness.   Psychiatric/Behavioral: Negative for confusion.   All other systems reviewed and are negative.    TRIAGE VITAL SIGNS:  Temp: 36.9 C (98.4 F) (12/27/16 2225)  Temp src: Oral (12/27/16 2225)  Pulse: 85 (12/27/16 2225)  BP: 103/68 (12/27/16 2225)  Resp: 20 (12/27/16 2225)  SpO2: 97 % (12/27/16 2225)  Weight: 62.2 kg (137 lb 2 oz) (12/27/16 2221)  Physical Exam   Constitutional: She is oriented to person, place, and time. She appears well-developed and well-nourished. No distress.   HENT:   Head: Normocephalic and atraumatic.   Mouth/Throat: Oropharynx is clear and moist.   Eyes: EOM are normal. Pupils are equal, round, and reactive to light.   Neck: Neck supple.   Cardiovascular: Normal rate and regular rhythm.    No murmur heard.  Pulmonary/Chest: Effort normal and breath sounds normal. No respiratory distress.   Abdominal: Soft. Bowel sounds are normal. She exhibits no distension. There is no tenderness.   Musculoskeletal: Normal range of motion.   Ecchymosis over ulnar hand and palm  Sensation intact over all 5 digits  Tenderness over 5th metacarpal head  FROM of right hand with appropriate finger flexion   Neurological: She is alert and oriented to person, place, and time.   Skin: Skin is warm and dry. No rash noted.   Psychiatric: She has a normal mood and affect.   Nursing note and vitals reviewed.    INITIAL ASSESSMENT & PLAN, MEDICAL DECISION MAKING, ED COURSE  Wendy IdeJaclinn Heffner is a [age]yr female who presents with a chief complaint of hand fracture.     Differential includes, but is not limited to:   Fracture, dislocation, sprain, strain    The results of the ED evaluation were notable for the following:    Chart Review: I reviewed the patient's prior medical records. Pertinent information that is relevant to this  encounter no [facility].    PATIENT SUMMARY  Wendy IdeJaclinn Power is a [age]yr old female presenting with hand fracture. Evaluated by medical student; however, eloped from emergency department prior to resident or attending evaluation. Per nursing was independently ambulatory out of the emergency department     LAST VITAL SIGNS:  Temp: 36.9 C (98.4 F) (12/27/16 2225)  Temp src: Oral (12/27/16 2225)  Pulse: 85 (12/27/16 2225)  BP: 103/68 (12/27/16 2225)  Resp: 20 (12/27/16 2225)  SpO2: 97 % (12/27/16 2225)  Weight: 62.2 kg (137 lb 2 oz) (12/27/16 2221)    Clinical Impression: Right Hand Pain    Disposition: Eloped    PATIENT'S GENERAL CONDITION:  Fair: Vital signs are stable and within normal limits. Patient is conscious but may be uncomfortable. Indicators are favorable.    Electronically signed by: Linward NatalNathan J Vanden Berge, MD, Resident    I did not evaluate this patient as she left before I or the Resident could see her. Do not bill.    Electronically signed by: Leatrice JewelsJohn R Melissa Tomaselli, MD, Attending Physician

## 2016-12-27 NOTE — ED Triage Note (Signed)
Pt amb to triage here c/o rt arm pain s/p removed splint applied x 1 wk pt was told had arm fx got her splint wet and removed yesterday. Pt sts try getting ortho appointment but no answer. Pt was told to came here due placing splint . +sensationn + pulse

## 2016-12-28 NOTE — ED Nursing Note (Signed)
Pt asked how the long wait for xray and splinting will be. When informed pt of no real definite time. Pt states "well, I need to go, I have kids and stuff to take care off". Pt states "I'll come back tomorrow to get this taken care of". Pt seen ambulating with upright gait out of ED with family.

## 2017-04-07 DIAGNOSIS — J42 Unspecified chronic bronchitis: Secondary | ICD-10-CM | POA: Diagnosis not present

## 2017-04-07 DIAGNOSIS — M79671 Pain in right foot: Secondary | ICD-10-CM | POA: Diagnosis not present

## 2017-05-10 ENCOUNTER — Emergency Department
Admission: EM | Admit: 2017-05-10 | Discharge: 2017-05-10 | Disposition: A | Discharge status: Home / Self Care | Payer: Medicaid Other | Attending: Emergency Medicine | Admitting: Emergency Medicine

## 2017-05-10 DIAGNOSIS — F329 Major depressive disorder, single episode, unspecified: Secondary | ICD-10-CM | POA: Insufficient documentation

## 2017-05-10 DIAGNOSIS — F17210 Nicotine dependence, cigarettes, uncomplicated: Secondary | ICD-10-CM | POA: Diagnosis not present

## 2017-05-10 DIAGNOSIS — F1410 Cocaine abuse, uncomplicated: Secondary | ICD-10-CM | POA: Insufficient documentation

## 2017-05-10 DIAGNOSIS — F1210 Cannabis abuse, uncomplicated: Secondary | ICD-10-CM | POA: Insufficient documentation

## 2017-05-10 DIAGNOSIS — Z046 Encounter for general psychiatric examination, requested by authority: Secondary | ICD-10-CM | POA: Diagnosis present

## 2017-05-10 DIAGNOSIS — F32A Depression, unspecified: Secondary | ICD-10-CM

## 2017-05-10 LAB — COMPREHENSIVE METABOLIC PANEL
ALT: 13 U/L — ABNORMAL LOW (ref 14–54)
ANION GAP: 8 (ref 5–15)
AST: 21 U/L (ref 15–41)
Albumin: 4.5 g/dL (ref 3.5–5.0)
Alkaline Phosphatase: 74 U/L (ref 38–126)
BUN: 6 mg/dL (ref 6–20)
CHLORIDE: 106 mmol/L (ref 101–111)
CO2: 26 mmol/L (ref 22–32)
Calcium: 9.4 mg/dL (ref 8.9–10.3)
Creatinine, Ser: 0.9 mg/dL (ref 0.44–1.00)
GFR calc Af Amer: >60 mL/min (ref >60)
GFR calc non Af Amer: >60 mL/min (ref >60)
Glucose, Bld: 91 mg/dL (ref 65–99)
Potassium: 3.6 mmol/L (ref 3.5–5.1)
SODIUM: 140 mmol/L (ref 135–145)
Total Bilirubin: 1.2 mg/dL (ref 0.3–1.2)
Total Protein: 7.3 g/dL (ref 6.5–8.1)

## 2017-05-10 LAB — CBC
HCT: 43.6 % (ref 35.0–47.0)
HEMOGLOBIN: 15.1 g/dL (ref 12.0–16.0)
MCH: 33.7 pg (ref 26.0–34.0)
MCHC: 34.7 g/dL (ref 32.0–36.0)
MCV: 97.3 fL (ref 80.0–100.0)
Platelets: 277 10*3/uL (ref 150–440)
RBC: 4.48 MIL/uL (ref 3.80–5.20)
RDW: 11.9 % (ref 11.5–14.5)
WBC: 11.9 10*3/uL — AB (ref 3.6–11.0)

## 2017-05-10 LAB — URINE DRUG SCREEN, QUALITATIVE (ARMC ONLY)
AMPHETAMINES, UR SCREEN: NOT DETECTED
BENZODIAZEPINE, UR SCRN: NOT DETECTED
Barbiturates, Ur Screen: NOT DETECTED
COCAINE METABOLITE, UR ~~LOC~~: POSITIVE — AB
Cannabinoid 50 Ng, Ur ~~LOC~~: POSITIVE — AB
MDMA (Ecstasy)Ur Screen: NOT DETECTED
METHADONE SCREEN, URINE: NOT DETECTED
OPIATE, UR SCREEN: NOT DETECTED
Phencyclidine (PCP) Ur S: NOT DETECTED
TRICYCLIC, UR SCREEN: NOT DETECTED

## 2017-05-10 LAB — POCT PREGNANCY, URINE: Preg Test, Ur: NEGATIVE

## 2017-05-10 LAB — ACETAMINOPHEN LEVEL

## 2017-05-10 LAB — ETHANOL: Alcohol, Ethyl (B): <10 mg/dL (ref <10)

## 2017-05-10 LAB — SALICYLATE LEVEL

## 2017-05-10 NOTE — ED Notes (Signed)
TTS spoke with Carrie Peterson.  She reiterated that she did not want to be admitted to inpatient hospital. She denied suicidal or homicidal ideation or intent.  She inquired about outpatient services for depression and anxiety.  She stated that she does not need a detox or treatment for substance use, stating, "I just use weed". TTS provided her information for multiple outpatient services in the community and provided the website for her insurance carrier to identify additional resources.

## 2017-05-10 NOTE — ED Notes (Signed)
Pt talking with SOC 

## 2017-05-10 NOTE — ED Notes (Signed)
SOC called, SOC placed in rm.

## 2017-05-10 NOTE — ED Notes (Signed)
This RN went over discharge information, instructions and follow up. Pt verified understanding of the DC information, instructions and follow up. Pt was given information about [facility] with phone number to call in the morning which pt states she will. Pt denies SI/HI and states "this is the first time I reached out for help so I will call [facility]."  Pt also notified of mychart and that pt can see her results following the directions which pt states she would do so. Pt A&O and has been changed back into her clothes and given her belongings. Pt ambulatory to lobby where pt met with her mother. Pt and mother have no further questions at this time.

## 2017-05-10 NOTE — ED Notes (Signed)
Pt's mother given another update on pt and plan of care.

## 2017-05-10 NOTE — ED Notes (Signed)
Report given to SOC 

## 2017-05-10 NOTE — ED Provider Notes (Addendum)
[facility] Emergency Department Provider Note  ____________________________________________   I have reviewed the triage vital signs and the nursing notes.   HISTORY  Chief Complaint Other (Involuntary Comittment)    HPI Carrie Peterson is a 21 y.o. female  who presents today complaining of marijuana use. She probably went to [facility], they did call over here, patient was asked routine screening questions there and told them that sometimes she has the thoughts of hurting herself. She has not had those thoughts recently. She has never tried to hurt herself. She has children, she does not want to hurt herself or anyone else. She contracts for safety. She states the misunderstanding. She states to be honest sometimes she has had in the past several months ago thoughts fleeting of driving into traffic however, that is not something that she will do or wants to do. She denies any other thoughts of self-harm ingestion etc. She does use marijuana which is why she went to [facility].     Past Medical History:  Diagnosis Date  . Anxiety   . Depression   . Fracture 02/13/2014   Back, 5 years ago.  . Medical history non-contributory   . PTSD (post-traumatic stress disorder)   . Ulcer     Patient Active Problem List   Diagnosis Date Noted  . Polysubstance abuse (HCC) 02/26/2014  . Mood disorder (HCC) 02/26/2014    Past Surgical History:  Procedure Laterality Date  . adnoids    . TONSILLECTOMY      Prior to Admission medications   Medication Sig Start Date End Date Taking? Authorizing Provider  diclofenac sodium (VOLTAREN) 1 % GEL Apply 1 application topically 4 (four) times daily. 11/25/14   Hayden Rasmussen, NP  etonogestrel (NEXPLANON) 68 MG IMPL implant Inject 68 mg into the skin once. 02/05/14   [provider]  omeprazole (PRILOSEC) 20 MG capsule Take 1 capsule (20 mg total) by mouth daily. 12/09/14   Cartner, Sharlet Salina, PA-C  ondansetron (ZOFRAN) 4 MG tablet Take 1  tablet (4 mg total) by mouth every 6 (six) hours. 12/09/14   Cartner, Sharlet Salina, PA-C  traMADol-acetaminophen (ULTRACET) 37.5-325 MG per tablet Take 1 tablet by mouth every 6 (six) hours as needed. 11/25/14   Hayden Rasmussen, NP    Allergies Patient has no known allergies.  Family History  Problem Relation Age of Onset  . Alcohol abuse Father   . Anxiety disorder Father   . Bipolar disorder Father   . Depression Father   . Drug abuse Father   . Arthritis Neg Hx   . Asthma Neg Hx   . Birth defects Neg Hx   . Cancer Neg Hx   . COPD Neg Hx   . Diabetes Neg Hx   . Early death Neg Hx   . Hearing loss Neg Hx   . Heart disease Neg Hx   . Hypertension Neg Hx   . Kidney disease Neg Hx   . Hyperlipidemia Neg Hx   . Learning disabilities Neg Hx   . Mental illness Neg Hx   . Mental retardation Neg Hx   . Miscarriages / Stillbirths Neg Hx   . Stroke Neg Hx   . Vision loss Neg Hx     Social History Social History  Substance Use Topics  . Smoking status: Current Every Day Smoker    Packs/day: 1.00    Types: Cigarettes    Last attempt to quit: 03/12/2013  . Smokeless tobacco: Never Used  . Alcohol use  No    Review of Systems Constitutional: No fever/chills Eyes: No visual changes. ENT: No sore throat. No stiff neck no neck pain Cardiovascular: Denies chest pain. Respiratory: Denies shortness of breath. Gastrointestinal:   no vomiting.  No diarrhea.  No constipation. Genitourinary: Negative for dysuria. Musculoskeletal: Negative lower extremity swelling Skin: Negative for rash. Neurological: Negative for severe headaches, focal weakness or numbness.   ____________________________________________   PHYSICAL EXAM:  VITAL SIGNS: ED Triage Vitals [05/10/17 1759]  Enc Vitals Group     BP 132/87     Pulse Rate 81     Resp 18     Temp 98.2 F (36.8 C)     Temp Source Oral     SpO2 98 %     Weight 146 lb (66.2 kg)     Height  (1.676 m)     Head Circumference      Peak  Flow      Pain Score      Pain Loc      Pain Edu?      Excl. in GC?     Constitutional: Alert and oriented. Well appearing and in no acute distress. Eyes: Conjunctivae are normal Head: Atraumatic HEENT: No congestion/rhinnorhea. Mucous membranes are moist.  Oropharynx non-erythematous Neck:   Nontender with no meningismus, no masses, no stridor Cardiovascular: Normal rate, regular rhythm. Grossly normal heart sounds.  Good peripheral circulation. Respiratory: Normal respiratory effort.  No retractions. Lungs CTAB. Abdominal: Soft and nontender. No distention. No guarding no rebound Back:  There is no focal tenderness or step off.  there is no midline tenderness there are no lesions noted. there is no CVA tenderness Musculoskeletal: No lower extremity tenderness, no upper extremity tenderness. No joint effusions, no DVT signs strong distal pulses no edema Neurologic:  Normal speech and language. No gross focal neurologic deficits are appreciated.  Skin:  Skin is warm, dry and intact. No rash noted. Psychiatric: Mood and affect are normal. Speech and behavior are normal.  ____________________________________________   LABS (all labs ordered are listed, but only abnormal results are displayed)  Labs Reviewed  COMPREHENSIVE METABOLIC PANEL - Abnormal; Notable for the following:       Result Value   ALT 13 (*)    All other components within normal limits  ACETAMINOPHEN LEVEL - Abnormal; Notable for the following:    Acetaminophen (Tylenol), Serum <10 (*)    All other components within normal limits  CBC - Abnormal; Notable for the following:    WBC 11.9 (*)    All other components within normal limits  URINE DRUG SCREEN, QUALITATIVE ([facility] ONLY) - Abnormal; Notable for the following:    Cocaine Metabolite,Ur [place] POSITIVE (*)    Cannabinoid 50 Ng, Ur [place] POSITIVE (*)    All other components within normal limits  ETHANOL  SALICYLATE LEVEL  POC URINE PREG, ED  POCT PREGNANCY, URINE     Pertinent labs  results that were available during my care of the patient were reviewed by me and considered in my medical decision making (see chart for details). ____________________________________________  EKG  I personally interpreted any EKGs ordered by me or triage  ____________________________________________  RADIOLOGY  Pertinent labs & imaging results that were available during my care of the patient were reviewed by me and considered in my medical decision making (see chart for details). If possible, patient and/or family made aware of any abnormal findings. ____________________________________________    PROCEDURES  Procedure(s) performed: None  Procedures  Critical Care performed: None  ____________________________________________   INITIAL IMPRESSION / ASSESSMENT AND PLAN / ED COURSE  Pertinent labs & imaging results that were available during my care of the patient were reviewed by me and considered in my medical decision making (see chart for details).  patient was sent him here under involuntary commitment however, shhe has no HI or any ongoing SI, she has no thoughts of herself or even else at this time, she feels that she should not be here, she would prefer to go. She contracts for safety. We'll have psychiatry evaluate her as she has been involuntarily committed.  ----------------------------------------- 9:39 PM on 05/10/2017 -----------------------------------------  psychiatry is reversed the patient's IVC she refuses to stay for any further counseling or assistance, she contracts for safety, she is counseled not to use while driving or taking care of her children. She has no si or hi. we cannot legally detain her any further, I see no evidence of imminent danger to anyone and we will discharge her.    ____________________________________________   FINAL CLINICAL IMPRESSION(S) / ED DIAGNOSES  Final diagnoses:  None      This chart was  dictated using voice recognition software.  Despite best efforts to proofread,  errors can occur which can change meaning.      Jeanmarie Plant, MD 05/10/17 Bennie Hind    Jeanmarie Plant, MD 05/10/17 2141

## 2017-05-10 NOTE — ED Notes (Signed)
Cell phone, apple watch in belonging back as well as shoes, clothes. Pt states she does not have any money in her change purse.

## 2017-05-10 NOTE — ED Notes (Signed)
ED Provider at bedside. 

## 2017-05-10 NOTE — ED Triage Notes (Signed)
Pt to ER under IVC from [facility]. Pt went voluntarily to [facility] for substance abuse assistance for mariajuana. Pt states that she made comment that she sometimes thinks about driving off road to kill herself. States that these thoughts are related to high stress times. Occur approx every 6 mo. No thoughts at this time. Pt denies HI. Pt alert and oriented X4, active, cooperative, pt in NAD. RR even and unlabored, color WNL.

## 2017-05-10 NOTE — ED Notes (Addendum)
Per pt's request, pt's mother given update on pt who is in lobby at this time.

## 2017-05-10 NOTE — BH Assessment (Signed)
Writer received phone call from [facility] Clydie Braun, Charity fundraiser) stating they received phone call from [facility] stating they receivd their referral and was going to accept the patient. Writer contacted [facility] and confirmed bed offer.  Patient has been accepted to [facility]. Patient assigned to room [facility] [address] [place], [place] [address] Accepting physician is Dr. Estill Cotta.  Call report to [phone].  Representative was [facility].  ER Staff is aware of it Maia Breslow, ER Sect.; Dr. Alphonzo Lemmings, ER MD & Baird Lyons, Patient's Nurse)     Patient bed will be available tomorrow (05/11/2017) and can transport anytime after 10:00am

## 2017-07-05 ENCOUNTER — Ambulatory Visit: Payer: Self-pay

## 2017-07-05 NOTE — Telephone Encounter (Signed)
Pt. Calling to requesting to be seen as a new pt. Today and she is also running a fever and has sore throat. States she is a pt. At [facility] , but wants change providers. She will go to urgent care today. Scheduled with Carrie Peterson as a new pt. In December.

## 2017-07-13 ENCOUNTER — Telehealth: Payer: Self-pay | Admitting: Family Medicine

## 2017-07-13 ENCOUNTER — Encounter: Payer: Self-pay | Admitting: Family Medicine

## 2017-07-13 ENCOUNTER — Ambulatory Visit (INDEPENDENT_AMBULATORY_CARE_PROVIDER_SITE_OTHER): Payer: Medicaid Other | Admitting: Family Medicine

## 2017-07-13 VITALS — BP 92/61 | HR 81 | Temp 98.6°F | Ht 66.0 in | Wt 154.6 lb

## 2017-07-13 DIAGNOSIS — J069 Acute upper respiratory infection, unspecified: Secondary | ICD-10-CM

## 2017-07-13 DIAGNOSIS — Z7689 Persons encountering health services in other specified circumstances: Secondary | ICD-10-CM | POA: Diagnosis not present

## 2017-07-13 DIAGNOSIS — F39 Unspecified mood [affective] disorder: Secondary | ICD-10-CM | POA: Diagnosis not present

## 2017-07-13 DIAGNOSIS — Z23 Encounter for immunization: Secondary | ICD-10-CM | POA: Diagnosis not present

## 2017-07-13 MED ORDER — HYDROCOD POLST-CPM POLST ER 10-8 MG/5ML PO SUER: 5.0000 mL | Freq: Two times a day (BID) | ORAL | 0 refills | Status: DC | PRN

## 2017-07-13 MED ORDER — BENZONATATE 200 MG PO CAPS: 200.0000 mg | ORAL_CAPSULE | Freq: Three times a day (TID) | ORAL | 0 refills | Status: DC | PRN

## 2017-07-13 MED ORDER — PREDNISONE 10 MG PO TABS: ORAL_TABLET | ORAL | 0 refills | Status: DC

## 2017-07-13 MED ORDER — AMOXICILLIN-POT CLAVULANATE 875-125 MG PO TABS: 1.0000 | ORAL_TABLET | Freq: Two times a day (BID) | ORAL | 0 refills | Status: DC

## 2017-07-13 MED ORDER — LURASIDONE HCL 20 MG PO TABS: 20.0000 mg | ORAL_TABLET | Freq: Every day | ORAL | 1 refills | Status: DC

## 2017-07-13 NOTE — Telephone Encounter (Signed)
Copied from CRM [phone]. Topic: General - Other >> Jul 13, 2017  3:02 PM Stephannie LiSimmons, Dayle Sherpa L, NT wrote: Patient went to pharmacy and prescription was too expensive for latuda please advise [phone]

## 2017-07-13 NOTE — Progress Notes (Signed)
BP 92/61 (BP Location: Right Arm, Patient Position: Sitting, Cuff Size: Normal)   Pulse 81   Temp 98.6 F (37 C) (Oral)   Ht 5\' 6"  (1.676 m)   Wt 154 lb 9.6 oz (70.1 kg)   SpO2 99%   BMI 24.95 kg/m    Subjective:    Patient ID: Carrie Peterson, female    DOB: 11/11/1995, 21 y.o.   MRN: [id number]  HPI: Carrie Peterson is a 21 y.o. female  Chief Complaint  Patient presents with  . Establish Care  . Depression    Wants to go back on depression/anxiety medication.   Marland Kitchen. Anxiety  . Cough    x's 3 months. Productive. White/brown sputum in color.   . Nasal Congestion   Patient here today to establish care.   Wanting to discuss ongoing productive cough and congestion x 3 months, Was given a steroid and inhaler in UC with no improvement. CXR done at this time which was normal. Is a smoker, no hx of asthma or allergies known.  First started having depression and anxiety about 2 years ago. Has been off medicines for about a year now. Previously tried Wellbutrin, prozac, lexapro, seroquel no benefit. No side effects. Has been told she has PTSD and bipolar disorder as well. Working on getting into a counseling program at this time. Denies SI/HI.   Past Medical History:  Diagnosis Date  . Anxiety   . Depression   . Fracture 02/13/2014   Back, 5 years ago.  . Medical history non-contributory   . PTSD (post-traumatic stress disorder)   . Ulcer    Social History   Socioeconomic History  . Marital status: Single    Spouse name: Not on file  . Number of children: Not on file  . Years of education: Not on file  . Highest education level: Not on file  Social Needs  . Financial resource strain: Not on file  . Food insecurity - worry: Not on file  . Food insecurity - inability: Not on file  . Transportation needs - medical: Not on file  . Transportation needs - non-medical: Not on file  Occupational History  . Not on file  Tobacco Use  . Smoking status: Current Every Day Smoker      Packs/day: 1.00    Types: Cigarettes    Last attempt to quit: 03/12/2013    Years since quitting: 4.3  . Smokeless tobacco: Never Used  Substance and Sexual Activity  . Alcohol use: No  . Drug use: No  . Sexual activity: Yes  Other Topics Concern  . Not on file  Social History Narrative  . Not on file   Relevant past medical, surgical, family and social history reviewed and updated as indicated. Interim medical history since our last visit reviewed. Allergies and medications reviewed and updated.  Review of Systems  Constitutional: Negative.   HENT: Positive for congestion.   Eyes: Negative.   Respiratory: Positive for cough and chest tightness.   Cardiovascular: Negative.   Gastrointestinal: Negative.   Genitourinary: Negative.   Musculoskeletal: Negative.   Neurological: Negative.   Psychiatric/Behavioral: Positive for agitation and dysphoric mood. The patient is nervous/anxious.     Per HPI unless specifically indicated above     Objective:    BP 92/61 (BP Location: Right Arm, Patient Position: Sitting, Cuff Size: Normal)   Pulse 81   Temp 98.6 F (37 C) (Oral)   Ht 5\' 6"  (1.676 m)   Wt 154 lb  9.6 oz (70.1 kg)   SpO2 99%   BMI 24.95 kg/m   Wt Readings from Last 3 Encounters:  07/13/17 154 lb 9.6 oz (70.1 kg)  05/10/17 146 lb (66.2 kg)  12/09/14 150 lb (68 kg)    Physical Exam  Constitutional: She is oriented to person, place, and time. She appears well-developed and well-nourished.  HENT:  Head: Atraumatic.  Right Ear: External ear normal.  Left Ear: External ear normal.  Nose: Nose normal.  Mouth/Throat: Oropharynx is clear and moist.  Oropharynx erythematous  Eyes: Conjunctivae are normal. Pupils are equal, round, and reactive to light.  Neck: Normal range of motion. Neck supple.  Cardiovascular: Normal rate and normal heart sounds.  Pulmonary/Chest: Effort normal and breath sounds normal. No respiratory distress. She has no wheezes. She has no  rales.  Musculoskeletal: Normal range of motion.  Neurological: She is alert and oriented to person, place, and time.  Skin: Skin is warm and dry.  Psychiatric: She has a normal mood and affect. Her behavior is normal. Judgment and thought content normal.  Nursing note and vitals reviewed.     Assessment & Plan:   Problem List Items Addressed This Visit      Other   Depression - Primary    Tried and failed multiple agents in the past. Will try latuda, if too expensive will try abilify. Monitor closely for benefit, side effects reviewed. If no benefit, will refer to behavioral health for further evaluation. Continue working on getting counseling set up.        Other Visit Diagnoses    Encounter to establish care       Need for influenza vaccination       Relevant Orders   Flu Vaccine QUAD 6+ mos PF IM (Fluarix Quad PF) (Completed)   Upper respiratory tract infection, unspecified type       Given chronicity, will treat with prednisone taper, tussionex, tessalon, and augmentin. Humidifier, mucinex, and other supportive care reviewed       Follow up plan: Return in about 4 weeks (around 08/10/2017) for Depression.

## 2017-07-14 MED ORDER — ARIPIPRAZOLE 10 MG PO TABS: 10.0000 mg | ORAL_TABLET | Freq: Every day | ORAL | 1 refills | Status: DC

## 2017-07-14 NOTE — Telephone Encounter (Signed)
Sent in Abilify to see if that was more affordable

## 2017-07-14 NOTE — Telephone Encounter (Signed)
Left message on machine for pt to return call to the office.  

## 2017-07-15 ENCOUNTER — Encounter: Payer: Self-pay | Admitting: Family Medicine

## 2017-07-15 ENCOUNTER — Ambulatory Visit: Payer: Medicaid Other | Admitting: Family Medicine

## 2017-07-15 VITALS — BP 110/69 | HR 68 | Resp 16 | Ht 66.0 in | Wt 157.8 lb

## 2017-07-15 DIAGNOSIS — Z308 Encounter for other contraceptive management: Secondary | ICD-10-CM | POA: Diagnosis not present

## 2017-07-15 DIAGNOSIS — Z113 Encounter for screening for infections with a predominantly sexual mode of transmission: Secondary | ICD-10-CM | POA: Diagnosis not present

## 2017-07-15 DIAGNOSIS — S6291XG Unspecified fracture of right wrist and hand, subsequent encounter for fracture with delayed healing: Secondary | ICD-10-CM

## 2017-07-15 NOTE — Patient Instructions (Signed)
Follow up in 1 month   

## 2017-07-15 NOTE — Assessment & Plan Note (Signed)
Tried and failed multiple agents in the past. Will try latuda, if too expensive will try abilify. Monitor closely for benefit, side effects reviewed. If no benefit, will refer to behavioral health for further evaluation. Continue working on getting counseling set up.

## 2017-07-15 NOTE — Progress Notes (Signed)
BP 110/69   Pulse 68   Resp 16   Ht 5\' 6"  (1.676 m)   Wt 157 lb 12.8 oz (71.6 kg)   LMP 07/14/2017 (Exact Date)   BMI 25.47 kg/m    Subjective:    Patient ID: Carrie Peterson, female    DOB: 04-09-1996, 21 y.o.   MRN: [id number]  HPI: Carrie Peterson is a 21 y.o. female  Chief Complaint  Patient presents with  . Exposure to STD    Dx 8 mo out of state for HPV. Wants additional testing to see if still has HPV.  . Contraception    Would like implant removed and new BC options.No current sexual partner at this time.       Patient presents today for multiple concerns.   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 Also wanting to be screened for STDs as it's been several years since she's been tested. Asymptomatic, no known exposures.  Has nexplanon x 3.5 years, knows it's overdue to be taken out. Wanting new one put in as long as removal goes well. Has not been sexually active x 6+ months, no chance of pregnancy.   Also wanting to discuss right hand 5th MCP pain and poor ROM/strength following fx about 6 months ago. States she [place] the hand cast[place] back when she lived in [place] but lost insurance and moved so she took the cast off herself and feels like it healed very poorly.   Past Medical History:  Diagnosis Date  . Anxiety   . Depression   . Fracture 02/13/2014   Back, 5 years ago.  . Medical history non-contributory   . PTSD (post-traumatic stress disorder)   . Ulcer    Social History   Socioeconomic History  . Marital status: Single    Spouse name: Not on file  . Number of children: Not on file  . Years of education: Not on file  . Highest education level: Not on file  Social Needs  . Financial resource strain: Not on file  . Food insecurity - worry: Not on file  . Food insecurity - inability: Not on file  . Transportation needs - medical: Not on file  . Transportation needs - non-medical: Not on file  Occupational History  .  Occupation: part time  Tobacco Use  . Smoking status: Current Every Day Smoker    Packs/day: 0.50    Types: Cigarettes    Last attempt to quit: 03/12/2013    Years since quitting: 4.3  . Smokeless tobacco: Never Used  Substance and Sexual Activity  . Alcohol use: No  . Drug use: No  . Sexual activity: Yes  Other Topics Concern  . Not on file  Social History Narrative  . Not on file   Relevant past medical, surgical, family and social history reviewed and updated as indicated. Interim medical history since our last visit reviewed. Allergies and medications reviewed and updated.  Review of Systems  Constitutional: Negative.   HENT: Negative.   Respiratory: Negative.   Cardiovascular: Negative.   Gastrointestinal: Negative.   Genitourinary: Negative.   Musculoskeletal: Positive for arthralgias.  Neurological: Negative.   Psychiatric/Behavioral: Negative.     Per HPI unless specifically indicated above     Objective:    BP 110/69   Pulse 68   Resp 16   Ht 5\' 6"  (1.676 m)   Wt 157 lb 12.8 oz (71.6 kg)   LMP 07/14/2017 (Exact Date)  BMI 25.47 kg/m   Wt Readings from Last 3 Encounters:  07/15/17 157 lb 12.8 oz (71.6 kg)  07/13/17 154 lb 9.6 oz (70.1 kg)  05/10/17 146 lb (66.2 kg)    Physical Exam  Constitutional: She is oriented to person, place, and time. She appears well-developed and well-nourished. No distress.  HENT:  Head: Atraumatic.  Eyes: Conjunctivae are normal. Pupils are equal, round, and reactive to light.  Neck: Normal range of motion. Neck supple.  Cardiovascular: Normal rate, normal heart sounds and intact distal pulses.  Pulmonary/Chest: Effort normal and breath sounds normal. No respiratory distress.  Musculoskeletal: She exhibits deformity (mild deformity at right 5th MCP).  Decreased strength and ROM at 4th and 5th digit right hand  Neurological: She is alert and oriented to person, place, and time.  Skin: Skin is warm and dry.  Psychiatric:  She has a normal mood and affect. Her behavior is normal.  Nursing note and vitals reviewed.     Assessment & Plan:   Problem List Items Addressed This Visit    None    Visit Diagnoses    Encounter for other contraceptive management    -  Primary   [facility] and possible re-placement. Pt aware product has expired and to use other means of protection   Relevant Orders   Ambulatory referral to Gynecology   GC/Chlamydia Probe Amp   Screening for STD (sexually transmitted disease)       Screening labs performed today. No known exposures or sxs. + HPV on Pap 8 mo ago, discussed repeat at 1 year from previous   Relevant Orders   HIV antibody   HSV(herpes simplex vrs) 1+2 ab-IgG   RPR   Closed fracture of right hand with delayed healing, subsequent encounter       Will re-xray and send to hand surgeon for eval as she's having mobility and strength issues with poor healing from previous casting.    Relevant Orders   DG Hand Complete Right   Ambulatory referral to Hand Surgery       Follow up plan: Return in about 4 weeks (around 08/12/2017) for Bipolar depression f/u.

## 2017-07-16 LAB — RPR: RPR: NONREACTIVE

## 2017-07-16 LAB — HSV(HERPES SIMPLEX VRS) I + II AB-IGG
HSV 1 Glycoprotein G Ab, IgG: <0.91 index (ref 0.00–0.90)
HSV 2 IgG, Type Spec: <0.91 index (ref 0.00–0.90)

## 2017-07-16 LAB — HIV ANTIBODY (ROUTINE TESTING W REFLEX): HIV SCREEN 4TH GENERATION: NONREACTIVE

## 2017-07-17 LAB — GC/CHLAMYDIA PROBE AMP
Chlamydia trachomatis, NAA: NEGATIVE
NEISSERIA GONORRHOEAE BY PCR: NEGATIVE

## 2017-07-20 ENCOUNTER — Telehealth: Payer: Self-pay | Admitting: Obstetrics & Gynecology

## 2017-07-20 NOTE — Telephone Encounter (Signed)
[facility] referring for Encounter for other contraceptive management. Called and lvm for pt to call back to be schedule

## 2017-07-26 ENCOUNTER — Ambulatory Visit (INDEPENDENT_AMBULATORY_CARE_PROVIDER_SITE_OTHER): Payer: Self-pay | Admitting: Orthopaedic Surgery

## 2017-07-27 NOTE — Telephone Encounter (Signed)
Called and lvm for pt to call back to be schedule °

## 2017-07-28 NOTE — Telephone Encounter (Signed)
Pt is schedule 08/05/17 with [facility]

## 2017-08-05 ENCOUNTER — Encounter: Payer: Medicaid Other | Admitting: Obstetrics and Gynecology

## 2017-08-09 ENCOUNTER — Ambulatory Visit (INDEPENDENT_AMBULATORY_CARE_PROVIDER_SITE_OTHER): Payer: Self-pay | Admitting: Orthopaedic Surgery

## 2017-08-09 ENCOUNTER — Ambulatory Visit (INDEPENDENT_AMBULATORY_CARE_PROVIDER_SITE_OTHER): Payer: Medicaid Other | Admitting: Obstetrics and Gynecology

## 2017-08-09 ENCOUNTER — Encounter: Payer: Self-pay | Admitting: Obstetrics and Gynecology

## 2017-08-09 VITALS — BP 100/60 | HR 78 | Ht 66.0 in | Wt 153.0 lb

## 2017-08-09 DIAGNOSIS — Z3049 Encounter for surveillance of other contraceptives: Secondary | ICD-10-CM

## 2017-08-09 DIAGNOSIS — Z3046 Encounter for surveillance of implantable subdermal contraceptive: Secondary | ICD-10-CM

## 2017-08-09 NOTE — Patient Instructions (Signed)
I value your feedback and entrusting us with your care. If you get a [facility] patient survey, I would appreciate you taking the time to let us know about your experience today. Thank you! 

## 2017-08-09 NOTE — Progress Notes (Signed)
   Chief Complaint  Patient presents with  . Contraception    wants nexplanon taken out; not sure she wants to be on bc     History of Present Illness:  Carrie Peterson is a 21 y.o. that had a nexplanon placed approximately 3 years ago. Since that time, she has had random bleeding, lasting 3-6 wks. She is referred by PCP ([facility]) for removal. She doesn't want any other BC currently. She is not sex active.   Nexplanon removal Procedure note - The Nexplanon was noted in the patient's arm and the end was identified. The skin was cleansed with a Betadine solution. A small injection of subcutaneous lidocaine with epinephrine was given over the end of the implant. An incision was made at the end of the implant. The rod was noted in the incision and grasped with a hemostat. It was noted to be intact.  Steri-Strip was placed approximating the incision. Hemostasis was noted.  Assessment: Nexplanon removal   Plan:   She was told to remove the dressing in 12-24 hours, to keep the incision area dry for 24 hours and to remove the Steristrip in 2-3  days.  Notify us if any signs of tenderness, redness, pain, or fevers develop.   Braison Snoke B. Ronan Duecker, PA-C 08/09/2017 2:57 PM

## 2017-08-09 NOTE — Addendum Note (Signed)
Addended by: Althea GrimmerOPLAND, Shuan Statzer B on: 08/09/2017 02:58 PM   Modules accepted: Orders

## 2017-08-13 ENCOUNTER — Ambulatory Visit: Payer: Self-pay | Admitting: Family Medicine

## 2017-08-16 ENCOUNTER — Telehealth: Payer: Self-pay

## 2017-08-16 NOTE — Telephone Encounter (Signed)
No Show Follow up Call  - patient missed appointment- states she forgot - reschedule requested - rescheduled for 08/20/2017 at 3:30pm for bipolar follow up - Pending diagnostic testing for this appointment: DG hand hasn't been completed from order on 07/15/2017 -  Transportation Concerns? NO

## 2017-08-18 NOTE — Telephone Encounter (Signed)
I had sent in a referral to hand surgeon per her request and gotten the x-ray ordered but I just looked at the referral and pt no-showed twice and then told them she didn't need the appt anymore. Assuming she won't want the x-ray now.

## 2017-08-20 ENCOUNTER — Ambulatory Visit: Payer: Self-pay | Admitting: Family Medicine

## 2017-08-25 ENCOUNTER — Ambulatory Visit: Payer: Medicaid Other | Admitting: Family Medicine

## 2017-08-25 ENCOUNTER — Telehealth: Payer: Self-pay | Admitting: Family Medicine

## 2017-08-25 NOTE — Telephone Encounter (Signed)
Pt no-showed the appt that you rescheduled from her prior no-show. Will need to be seen for this f/u prior to any further medication refills.

## 2017-09-05 ENCOUNTER — Encounter: Payer: Self-pay | Admitting: Family Medicine

## 2017-10-15 ENCOUNTER — Ambulatory Visit (INDEPENDENT_AMBULATORY_CARE_PROVIDER_SITE_OTHER): Payer: Self-pay | Admitting: Family Medicine

## 2017-10-15 ENCOUNTER — Encounter: Payer: Self-pay | Admitting: Family Medicine

## 2017-10-15 VITALS — BP 109/64 | HR 75 | Temp 98.2°F | Wt 146.5 lb

## 2017-10-15 DIAGNOSIS — R053 Chronic cough: Secondary | ICD-10-CM

## 2017-10-15 DIAGNOSIS — R05 Cough: Secondary | ICD-10-CM

## 2017-10-15 DIAGNOSIS — R109 Unspecified abdominal pain: Secondary | ICD-10-CM

## 2017-10-15 DIAGNOSIS — Z308 Encounter for other contraceptive management: Secondary | ICD-10-CM

## 2017-10-15 DIAGNOSIS — N390 Urinary tract infection, site not specified: Secondary | ICD-10-CM

## 2017-10-15 LAB — PREGNANCY, URINE: PREG TEST UR: NEGATIVE

## 2017-10-15 MED ORDER — SULFAMETHOXAZOLE-TRIMETHOPRIM 800-160 MG PO TABS: 1.0000 | ORAL_TABLET | Freq: Two times a day (BID) | ORAL | 0 refills | Status: DC

## 2017-10-15 MED ORDER — ETONOGESTREL-ETHINYL ESTRADIOL 0.12-0.015 MG/24HR VA RING: VAGINAL_RING | VAGINAL | 12 refills | Status: DC

## 2017-10-15 MED ORDER — ALBUTEROL SULFATE (2.5 MG/3ML) 0.083% IN NEBU
2.5000 mg | INHALATION_SOLUTION | Freq: Once | RESPIRATORY_TRACT | Status: AC
  Administered 2017-10-15: 2.5 mg via RESPIRATORY_TRACT

## 2017-10-15 MED ORDER — UMECLIDINIUM-VILANTEROL 62.5-25 MCG/INH IN AEPB: 1.0000 | INHALATION_SPRAY | Freq: Every day | RESPIRATORY_TRACT | 1 refills | Status: DC

## 2017-10-15 NOTE — Progress Notes (Signed)
BP 109/64 (BP Location: Left Arm, Patient Position: Sitting, Cuff Size: Normal)   Pulse 75   Temp 98.2 F (36.8 C) (Tympanic)   Wt 146 lb 8 oz (66.5 kg)   SpO2 99%   BMI 23.65 kg/m    Subjective:    Patient ID: Carrie Peterson, female    DOB: April 05, 1996, 22 y.o.   MRN: [id number]  HPI: Carrie Peterson is a 22 y.o. female  Chief Complaint  Patient presents with  . Flank Pain    x's 3 days. Left side. Patient thinks she has a UTI.   . Contraception    Patient wants to be on Birth Control  . Cough    Patient states she can't get rid of the coughing she has been having. Brown phlegm.   Pt here today with 3 days of left sided flank pain and urinary frequency. Denies dysuria, fevers, nausea vomiting, hematuria. Hx of UTIs, states this feels similar. No concern for STIs or vaginal infections, just wants urine checked. Denies any injury that could be causing back pain independently.   Also wanting to discuss new contraception. Has used depo in the past, didn't like. Was on nexplanon afterward which made her irritable, low libido, and gain weight. Has had it out for about 2 months now. Periods are regular and tolerable sxs. No hx of migraines, blood clots, or fhx of breast cancer.   Productive cough x 3 months, previously green and now brownish dark tone to it. Some wheezing and SOB. Tried zquil, dayquil, nyquil, mucinex. Was given a steroid and an albuterol inhaler at an UC about 3 months ago with no relief and then was given steroid taper, abx, tessalon, and tussionex also with no relief. Has cut back on cigarettes from 1 pack to 6 cigarettes daily the past 6 months. Denies known hx of pulmonary dz, fevers, SOB, CP.   Past Medical History:  Diagnosis Date  . Anxiety   . Depression   . Fracture 02/13/2014   Back, 5 years ago.  . Medical history non-contributory   . PTSD (post-traumatic stress disorder)   . Ulcer    Social History   Socioeconomic History  . Marital status: Single      Spouse name: Not on file  . Number of children: Not on file  . Years of education: Not on file  . Highest education level: Not on file  Social Needs  . Financial resource strain: Not on file  . Food insecurity - worry: Not on file  . Food insecurity - inability: Not on file  . Transportation needs - medical: Not on file  . Transportation needs - non-medical: Not on file  Occupational History  . Occupation: part time  Tobacco Use  . Smoking status: Current Every Day Smoker    Packs/day: 0.25    Types: Cigarettes    Last attempt to quit: 03/12/2013    Years since quitting: 4.6  . Smokeless tobacco: Never Used  Substance and Sexual Activity  . Alcohol use: No  . Drug use: No  . Sexual activity: No    Birth control/protection: Implant  Other Topics Concern  . Not on file  Social History Narrative  . Not on file   Relevant past medical, surgical, family and social history reviewed and updated as indicated. Interim medical history since our last visit reviewed. Allergies and medications reviewed and updated.  Review of Systems  Per HPI unless specifically indicated above     Objective:  BP 109/64 (BP Location: Left Arm, Patient Position: Sitting, Cuff Size: Normal)   Pulse 75   Temp 98.2 F (36.8 C) (Tympanic)   Wt 146 lb 8 oz (66.5 kg)   SpO2 99%   BMI 23.65 kg/m   Wt Readings from Last 3 Encounters:  10/15/17 146 lb 8 oz (66.5 kg)  08/09/17 153 lb (69.4 kg)  07/15/17 157 lb 12.8 oz (71.6 kg)    Physical Exam  Results for orders placed or performed in visit on 10/15/17  Microscopic Examination  Result Value Ref Range   WBC, UA 6-10 (A) 0 - 5 /hpf   RBC, UA None seen 0 - 2 /hpf   Epithelial Cells (non renal) 0-10 0 - 10 /hpf   Mucus, UA Present (A) Not Estab.   Bacteria, UA Few (A) None seen/Few  Urine Culture, Reflex  Result Value Ref Range   Urine Culture, Routine Final report    Organism ID, Bacteria Comment   UA/M w/rflx Culture, Routine  Result  Value Ref Range   Specific Gravity, UA 1.020 1.005 - 1.030   pH, UA 7.0 5.0 - 7.5   Color, UA Yellow Yellow   Appearance Ur Cloudy (A) Clear   Leukocytes, UA 1+ (A) Negative   Protein, UA Negative Negative/Trace   Glucose, UA Negative Negative   Ketones, UA Negative Negative   RBC, UA Negative Negative   Bilirubin, UA Negative Negative   Urobilinogen, Ur 1.0 0.2 - 1.0 mg/dL   Nitrite, UA Negative Negative   Microscopic Examination See below:    Urinalysis Reflex Comment   Pregnancy, urine  Result Value Ref Range   Preg Test, Ur Negative Negative      Assessment & Plan:   Problem List Items Addressed This Visit    None    Visit Diagnoses    Acute lower UTI    -  Primary   Will tx with bactrim and await cx. Push fluids, probiotics. F/u if worsening or no improvement   Relevant Medications   sulfamethoxazole-trimethoprim (BACTRIM DS,SEPTRA DS) 800-160 MG tablet   Other Relevant Orders   UA/M w/rflx Culture, Routine (Completed)   Microscopic Examination (Completed)   Urine Culture, Reflex (Completed)   Encounter for other contraceptive management       Urine preg neg, discussed options and pt wanting to try nuva ring. Risks/benefits reviewed. F/u with any concerns   Relevant Orders   Pregnancy, urine (Completed)   Chronic cough       Cleda DaubSpiro w/ mild restriction, not improved on post-test after neb. No improvement with prednisone or albuterol.Start anoro daily, get CXR, and monitor for benefit    Relevant Medications   albuterol (PROVENTIL) (2.5 MG/3ML) 0.083% nebulizer solution 2.5 mg (Completed)   Other Relevant Orders   Spirometry with Graph (Completed)   DG Chest 2 View       Follow up plan: Return for Cough f/u if no better.

## 2017-10-17 LAB — UA/M W/RFLX CULTURE, ROUTINE
Bilirubin, UA: NEGATIVE
Glucose, UA: NEGATIVE
Ketones, UA: NEGATIVE
Nitrite, UA: NEGATIVE
PH UA: 7 (ref 5.0–7.5)
Protein, UA: NEGATIVE
RBC UA: NEGATIVE
Specific Gravity, UA: 1.02 (ref 1.005–1.030)
UUROB: 1 mg/dL (ref 0.2–1.0)

## 2017-10-17 LAB — URINE CULTURE, REFLEX

## 2017-10-17 LAB — MICROSCOPIC EXAMINATION: RBC MICROSCOPIC, UA: NONE SEEN /HPF (ref 0–?)

## 2017-10-18 NOTE — Patient Instructions (Signed)
Follow up as needed

## 2017-11-28 ENCOUNTER — Other Ambulatory Visit: Payer: Self-pay

## 2017-11-28 ENCOUNTER — Emergency Department: Payer: Medicaid Other

## 2017-11-28 ENCOUNTER — Encounter: Payer: Self-pay | Admitting: Emergency Medicine

## 2017-11-28 DIAGNOSIS — F17210 Nicotine dependence, cigarettes, uncomplicated: Secondary | ICD-10-CM | POA: Insufficient documentation

## 2017-11-28 DIAGNOSIS — Y929 Unspecified place or not applicable: Secondary | ICD-10-CM | POA: Insufficient documentation

## 2017-11-28 DIAGNOSIS — Y939 Activity, unspecified: Secondary | ICD-10-CM | POA: Insufficient documentation

## 2017-11-28 DIAGNOSIS — Z79899 Other long term (current) drug therapy: Secondary | ICD-10-CM | POA: Insufficient documentation

## 2017-11-28 DIAGNOSIS — S8991XA Unspecified injury of right lower leg, initial encounter: Secondary | ICD-10-CM | POA: Diagnosis present

## 2017-11-28 DIAGNOSIS — Y999 Unspecified external cause status: Secondary | ICD-10-CM | POA: Diagnosis not present

## 2017-11-28 DIAGNOSIS — S82141A Displaced bicondylar fracture of right tibia, initial encounter for closed fracture: Secondary | ICD-10-CM | POA: Diagnosis not present
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
## 2017-11-29 ENCOUNTER — Emergency Department: Payer: Medicaid Other

## 2017-11-29 ENCOUNTER — Emergency Department
Admission: EM | Admit: 2017-11-29 | Discharge: 2017-11-29 | Disposition: A | Discharge status: Home / Self Care | Payer: Medicaid Other | Attending: Emergency Medicine | Admitting: Emergency Medicine

## 2017-11-29 DIAGNOSIS — S82141A Displaced bicondylar fracture of right tibia, initial encounter for closed fracture: Secondary | ICD-10-CM

## 2017-11-29 DIAGNOSIS — M25561 Pain in right knee: Secondary | ICD-10-CM

## 2017-11-29 LAB — POCT PREGNANCY, URINE: Preg Test, Ur: NEGATIVE

## 2017-11-29 MED ORDER — DIAZEPAM 2 MG PO TABS
2.0000 mg | ORAL_TABLET | Freq: Once | ORAL | Status: AC
Start: 2017-11-29 — End: 2017-11-29
  Administered 2017-11-29: 2 mg via ORAL
  Filled 2017-11-29: qty 1

## 2017-11-29 MED ORDER — OXYCODONE-ACETAMINOPHEN 5-325 MG PO TABS
1.0000 | ORAL_TABLET | Freq: Once | ORAL | Status: AC
  Administered 2017-11-29: 1 via ORAL
  Filled 2017-11-29: qty 1

## 2017-11-29 MED ORDER — OXYCODONE-ACETAMINOPHEN 5-325 MG PO TABS: 1.0000 | ORAL_TABLET | ORAL | 0 refills | Status: DC | PRN

## 2017-11-29 MED ORDER — ONDANSETRON 4 MG PO TBDP
4.0000 mg | ORAL_TABLET | Freq: Once | ORAL | Status: AC
  Administered 2017-11-29: 4 mg via ORAL
  Filled 2017-11-29: qty 1
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
## 2017-11-29 NOTE — ED Notes (Signed)
Report from jenna, rn.  

## 2017-11-29 NOTE — Discharge Instructions (Addendum)
1.  You may remove knee immobilizer to bathe and sleep; otherwise please keep it on. 2.  Do not bear weight on your right leg.  Use crutches to walk. 3.  Elevate affected area and apply ice several times daily to reduce swelling. 4.  You may take Percocet as needed for pain. 5.  Return to the ER for worsening symptoms, increased swelling, persistent vomiting, difficulty breathing or other concerns.

## 2017-11-30 DIAGNOSIS — S82101A Unspecified fracture of upper end of right tibia, initial encounter for closed fracture: Secondary | ICD-10-CM | POA: Diagnosis not present

## 2018-05-16 DIAGNOSIS — B349 Viral infection, unspecified: Secondary | ICD-10-CM | POA: Diagnosis not present

## 2018-05-23 DIAGNOSIS — J019 Acute sinusitis, unspecified: Secondary | ICD-10-CM | POA: Diagnosis not present

## 2018-06-02 ENCOUNTER — Inpatient Hospital Stay (HOSPITAL_COMMUNITY)
Admission: AD | Admit: 2018-06-02 | Discharge: 2018-06-03 | Disposition: A | Discharge status: Home / Self Care | Payer: Medicaid Other | Source: Ambulatory Visit | Attending: Obstetrics and Gynecology | Admitting: Obstetrics and Gynecology

## 2018-06-02 ENCOUNTER — Encounter (HOSPITAL_COMMUNITY): Payer: Self-pay

## 2018-06-02 DIAGNOSIS — N939 Abnormal uterine and vaginal bleeding, unspecified: Secondary | ICD-10-CM | POA: Insufficient documentation

## 2018-06-02 DIAGNOSIS — O039 Complete or unspecified spontaneous abortion without complication: Secondary | ICD-10-CM

## 2018-06-02 DIAGNOSIS — F17210 Nicotine dependence, cigarettes, uncomplicated: Secondary | ICD-10-CM | POA: Insufficient documentation

## 2018-06-02 DIAGNOSIS — Z3202 Encounter for pregnancy test, result negative: Secondary | ICD-10-CM | POA: Insufficient documentation

## 2018-06-02 LAB — URINALYSIS, ROUTINE W REFLEX MICROSCOPIC
Bilirubin Urine: NEGATIVE
Glucose, UA: NEGATIVE mg/dL
Ketones, ur: NEGATIVE mg/dL
Leukocytes, UA: NEGATIVE
Nitrite: NEGATIVE
PROTEIN: NEGATIVE mg/dL
RBC / HPF: >50 RBC/hpf — ABNORMAL HIGH (ref 0–5)
Specific Gravity, Urine: 1.012 (ref 1.005–1.030)
Trans Epithel, UA: <1
pH: 7 (ref 5.0–8.0)

## 2018-06-02 LAB — CBC WITH DIFFERENTIAL/PLATELET
BASOS PCT: 0 %
Basophils Absolute: 0 10*3/uL (ref 0.0–0.1)
EOS PCT: 1 %
Eosinophils Absolute: 0.1 10*3/uL (ref 0.0–0.5)
HCT: 39.8 % (ref 36.0–46.0)
Hemoglobin: 13.9 g/dL (ref 12.0–15.0)
LYMPHS ABS: 2.9 10*3/uL (ref 0.7–4.0)
LYMPHS PCT: 27 %
MCH: 34.1 pg — ABNORMAL HIGH (ref 26.0–34.0)
MCHC: 34.9 g/dL (ref 30.0–36.0)
MCV: 97.5 fL (ref 80.0–100.0)
MONO ABS: 0.4 10*3/uL (ref 0.1–1.0)
Monocytes Relative: 4 %
NRBC: 0 % (ref 0.0–0.2)
Neutro Abs: 7.4 10*3/uL (ref 1.7–7.7)
Neutrophils Relative %: 68 %
PLATELETS: 253 10*3/uL (ref 150–400)
RBC: 4.08 MIL/uL (ref 3.87–5.11)
RDW: 12.3 % (ref 11.5–15.5)
WBC: 10.9 10*3/uL — AB (ref 4.0–10.5)

## 2018-06-02 LAB — HCG, QUANTITATIVE, PREGNANCY: hCG, Beta Chain, Quant, S: <1 m[IU]/mL (ref <5)

## 2018-06-02 LAB — POCT PREGNANCY, URINE: PREG TEST UR: NEGATIVE

## 2018-06-02 NOTE — MAU Provider Note (Signed)
Chief Complaint:  Abdominal Pain and Vaginal Bleeding   First Provider Initiated Contact with Patient 06/02/18 2208     [facility]  HPI: Carrie Peterson is a 22 y.o. [id number] who presents to maternity admissions reporting positive pregnancy tests 2 weeks ago.  Started having spotting and then tonight bleeding got heavier with cramps.  Pain is mostly in left lower area.   Trying to get pregnant  . She reports vaginal bleeding, vaginal itching/burning, urinary symptoms, h/a, dizziness, n/v, or fever/chills.   Has an appointment tomorrow at [facility] for pregnancy evaluation  Abdominal Pain  This is a new problem. The current episode started in the past 7 days. The problem occurs intermittently. The problem has been unchanged. The pain is located in the suprapubic region and LLQ. The quality of the pain is cramping. The abdominal pain does not radiate. Pertinent negatives include no constipation, diarrhea, dysuria or fever. Nothing aggravates the pain. The pain is relieved by nothing. She has tried nothing for the symptoms.  Vaginal Bleeding  The patient's primary symptoms include pelvic pain and vaginal bleeding. The patient's pertinent negatives include no genital itching, genital lesions or genital odor. The current episode started today. The problem has been unchanged. Associated symptoms include abdominal pain. Pertinent negatives include no constipation, diarrhea, dysuria or fever. The vaginal discharge was bloody. The vaginal bleeding is lighter than menses. Nothing aggravates the symptoms. She has tried nothing for the symptoms.   RN Note: Pt states she had 2 +upt 2 weeks ago. States this morning she started having some spotting. Reports tonight the bleeding has darkened and become somewhat heavier. Started having some LLQ pain and cramping that started about 2 hours prior to arrival. Pt has not taken anything for pain. LMP: around 04/06/18  Past Medical History: Past Medical History:   Diagnosis Date  . Anxiety   . Depression   . Fracture 02/13/2014   Back, 5 years ago.  . Medical history non-contributory   . PTSD (post-traumatic stress disorder)   . Ulcer     Past obstetric history: OB History  Gravida Para Term Preterm AB Living  2 2 2     2   SAB TAB Ectopic Multiple Live Births          2    # Outcome Date GA Lbr Len/2nd Weight Sex Delivery Anes PTL Lv  2 Term 04/11/13 [redacted]w[redacted]d 09:20 / 00:14 4055 g F Vag-Spont EPI  LIV     Birth Comments: wnl  1 Term     F Vag-Spont EPI N LIV    Past Surgical History: Past Surgical History:  Procedure Laterality Date  . adnoids    . TONSILLECTOMY      Family History: Family History  Problem Relation Age of Onset  . Alcohol abuse Father   . Anxiety disorder Father   . Bipolar disorder Father   . Depression Father   . Drug abuse Father   . Diabetes Father   . Hypertension Father   . Depression Mother   . Multiple sclerosis Mother   . COPD Maternal Uncle   . Cancer Maternal Grandmother   . COPD Maternal Grandmother   . Arthritis Neg Hx   . Asthma Neg Hx   . Birth defects Neg Hx   . Early death Neg Hx   . Hearing loss Neg Hx   . Heart disease Neg Hx   . Kidney disease Neg Hx   . Hyperlipidemia Neg Hx   . Learning  disabilities Neg Hx   . Mental illness Neg Hx   . Mental retardation Neg Hx   . Miscarriages / Stillbirths Neg Hx   . Stroke Neg Hx   . Vision loss Neg Hx     Social History: Social History   Tobacco Use  . Smoking status: Current Every Day Smoker    Packs/day: 0.50    Types: Cigarettes    Last attempt to quit: 03/12/2013    Years since quitting: 5.2  . Smokeless tobacco: Never Used  Substance Use Topics  . Alcohol use: No  . Drug use: No    Allergies: No Known Allergies  Meds:  Medications Prior to Admission  Medication Sig Dispense Refill Last Dose  . etonogestrel-ethinyl estradiol (NUVARING) 0.12-0.015 MG/24HR vaginal ring Insert vaginally and leave in place for 3 consecutive  weeks, then remove for 1 week. 1 each 12   . oxyCODONE-acetaminophen (PERCOCET/ROXICET) 5-325 MG tablet Take 1 tablet by mouth every 4 (four) hours as needed for severe pain. 30 tablet 0   . sulfamethoxazole-trimethoprim (BACTRIM DS,SEPTRA DS) 800-160 MG tablet Take 1 tablet by mouth 2 (two) times daily. 6 tablet 0   . umeclidinium-vilanterol (ANORO ELLIPTA) 62.5-25 MCG/INH AEPB Inhale 1 puff into the lungs daily. 60 each 1     I have reviewed patient's Past Medical Hx, Surgical Hx, Family Hx, Social Hx, medications and allergies.  ROS:  Review of Systems  Constitutional: Negative for fever.  Gastrointestinal: Positive for abdominal pain. Negative for constipation and diarrhea.  Genitourinary: Positive for pelvic pain and vaginal bleeding. Negative for dysuria.   Other systems negative     Physical Exam   Patient Vitals for the past 24 hrs:  BP Temp Temp src Pulse Resp SpO2 Height Weight  06/02/18 2131 113/61 98.2 F (36.8 C) Oral 74 16 100 % 5\' 6"  (1.676 m) 65.3 kg   Constitutional: Well-developed, well-nourished female in no acute distress.  Cardiovascular: normal rate and rhythm, no ectopy audible, S1 & S2 heard, no murmur Respiratory: normal effort, no distress. Lungs CTAB with no wheezes or crackles GI: Abd soft, non-tender.  Nondistended.  No rebound, No guarding.  Bowel Sounds audible  MS: Extremities nontender, no edema, normal ROM Neurologic: Alert and oriented x 4.   Grossly nonfocal. GU: Neg CVAT. Skin:  Warm and Dry Psych:  Affect appropriate.  PELVIC EXAM: Cervix pink, visually closed, without lesion, scant red discharge, vaginal walls and external genitalia normal Bimanual exam: Cervix firm, anterior, neg CMT, uterus mildly tender, nonenlarged, adnexa without tenderness, enlargement, or mass    Labs:    Results for orders placed or performed during the hospital encounter of 06/02/18 (from the past 24 hour(s))  Urinalysis, Routine w reflex microscopic      Status: Abnormal   Collection Time: 06/02/18  9:20 PM  Result Value Ref Range   Color, Urine YELLOW YELLOW   APPearance CLEAR CLEAR   Specific Gravity, Urine 1.012 1.005 - 1.030   pH 7.0 5.0 - 8.0   Glucose, UA NEGATIVE NEGATIVE mg/dL   Hgb urine dipstick LARGE (A) NEGATIVE   Bilirubin Urine NEGATIVE NEGATIVE   Ketones, ur NEGATIVE NEGATIVE mg/dL   Protein, ur NEGATIVE NEGATIVE mg/dL   Nitrite NEGATIVE NEGATIVE   Leukocytes, UA NEGATIVE NEGATIVE   RBC / HPF >50 (H) 0 - 5 RBC/hpf   WBC, UA 0-5 0 - 5 WBC/hpf   Bacteria, UA RARE (A) NONE SEEN   Squamous Epithelial / LPF 0-5 0 - 5  Trans Epithel, UA <1    Mucus PRESENT   Pregnancy, urine POC     Status: None   Collection Time: 06/02/18  9:39 PM  Result Value Ref Range   Preg Test, Ur NEGATIVE NEGATIVE  hCG, quantitative, pregnancy     Status: None   Collection Time: 06/02/18 10:12 PM  Result Value Ref Range   hCG, Beta Chain, Quant, S <1 <5 mIU/mL  CBC with Differential/Platelet     Status: Abnormal   Collection Time: 06/02/18 10:12 PM  Result Value Ref Range   WBC 10.9 (H) 4.0 - 10.5 K/uL   RBC 4.08 3.87 - 5.11 MIL/uL   Hemoglobin 13.9 12.0 - 15.0 g/dL   HCT 09.8 11.9 - 14.7 %   MCV 97.5 80.0 - 100.0 fL   MCH 34.1 (H) 26.0 - 34.0 pg   MCHC 34.9 30.0 - 36.0 g/dL   RDW 82.9 56.2 - 13.0 %   Platelets 253 150 - 400 K/uL   nRBC 0.0 0.0 - 0.2 %   Neutrophils Relative % 68 %   Neutro Abs 7.4 1.7 - 7.7 K/uL   Lymphocytes Relative 27 %   Lymphs Abs 2.9 0.7 - 4.0 K/uL   Monocytes Relative 4 %   Monocytes Absolute 0.4 0.1 - 1.0 K/uL   Eosinophils Relative 1 %   Eosinophils Absolute 0.1 0.0 - 0.5 K/uL   Basophils Relative 0 %   Basophils Absolute 0.0 0.0 - 0.1 K/uL     Imaging:  No results found.  MAU Course/MDM: I have ordered labs as follows:  Quant HCG is negative Imaging ordered: none Results reviewed. Discussed possibility this was either a false-positive test or a very early miscarriage..   Treatments in MAU  included none.   Pt stable at time of discharge.  Assessment: Postive pregnancy test at home with negative test here Possible early miscarriage  Plan: Discharge home Recommend follow up with office as schedule Rx sent for ibuprofen and a few percocet  for pain  Encouraged to return here or to other Urgent Care/ED if she develops worsening of symptoms, increase in pain, fever, or other concerning symptoms.   Wynelle Bourgeois CNM, MSN Certified Nurse-Midwife 06/02/2018 10:09 PM

## 2018-06-02 NOTE — MAU Note (Signed)
Pt states she had 2 +upt 2 weeks ago. States this morning she started having some spotting. Reports tonight the bleeding has darkened and become somewhat heavier. Started having some LLQ pain and cramping that started about 2 hours prior to arrival. Pt has not taken anything for pain. LMP: around 04/06/18

## 2018-06-03 ENCOUNTER — Other Ambulatory Visit: Payer: Self-pay | Admitting: Advanced Practice Midwife

## 2018-06-03 DIAGNOSIS — O039 Complete or unspecified spontaneous abortion without complication: Secondary | ICD-10-CM

## 2018-06-03 MED ORDER — IBUPROFEN 600 MG PO TABS: 600.0000 mg | ORAL_TABLET | Freq: Four times a day (QID) | ORAL | 1 refills | Status: DC | PRN

## 2018-06-03 MED ORDER — PRENATAL VITAMINS 0.8 MG PO TABS: 1.0000 | ORAL_TABLET | Freq: Every day | ORAL | 12 refills | Status: DC

## 2018-06-03 MED ORDER — OXYCODONE-ACETAMINOPHEN 5-325 MG PO TABS: 1.0000 | ORAL_TABLET | Freq: Four times a day (QID) | ORAL | 0 refills | Status: DC | PRN

## 2018-06-03 NOTE — Discharge Instructions (Signed)

## 2018-07-12 DIAGNOSIS — O3680X9 Pregnancy with inconclusive fetal viability, other fetus: Secondary | ICD-10-CM | POA: Diagnosis not present

## 2018-07-13 ENCOUNTER — Encounter (HOSPITAL_COMMUNITY): Payer: Self-pay | Admitting: *Deleted

## 2018-07-13 ENCOUNTER — Inpatient Hospital Stay (HOSPITAL_COMMUNITY)
Admission: AD | Admit: 2018-07-13 | Discharge: 2018-07-13 | Disposition: A | Discharge status: Home / Self Care | Payer: Medicaid Other | Source: Ambulatory Visit | Attending: Obstetrics | Admitting: Obstetrics

## 2018-07-13 DIAGNOSIS — Z3A01 Less than 8 weeks gestation of pregnancy: Secondary | ICD-10-CM | POA: Insufficient documentation

## 2018-07-13 DIAGNOSIS — O211 Hyperemesis gravidarum with metabolic disturbance: Secondary | ICD-10-CM | POA: Insufficient documentation

## 2018-07-13 DIAGNOSIS — O219 Vomiting of pregnancy, unspecified: Secondary | ICD-10-CM

## 2018-07-13 DIAGNOSIS — E860 Dehydration: Secondary | ICD-10-CM

## 2018-07-13 DIAGNOSIS — F17210 Nicotine dependence, cigarettes, uncomplicated: Secondary | ICD-10-CM | POA: Insufficient documentation

## 2018-07-13 DIAGNOSIS — O99331 Smoking (tobacco) complicating pregnancy, first trimester: Secondary | ICD-10-CM | POA: Diagnosis not present

## 2018-07-13 DIAGNOSIS — O26891 Other specified pregnancy related conditions, first trimester: Secondary | ICD-10-CM

## 2018-07-13 DIAGNOSIS — O210 Mild hyperemesis gravidarum: Secondary | ICD-10-CM | POA: Diagnosis present

## 2018-07-13 LAB — URINALYSIS, ROUTINE W REFLEX MICROSCOPIC
Bacteria, UA: NONE SEEN
Bilirubin Urine: NEGATIVE
GLUCOSE, UA: NEGATIVE mg/dL
Hgb urine dipstick: NEGATIVE
KETONES UR: 80 mg/dL — AB
Nitrite: NEGATIVE
PH: 8 (ref 5.0–8.0)
Protein, ur: NEGATIVE mg/dL
Specific Gravity, Urine: 1.02 (ref 1.005–1.030)

## 2018-07-13 LAB — POCT PREGNANCY, URINE: Preg Test, Ur: POSITIVE — AB

## 2018-07-13 MED ORDER — RANITIDINE HCL 150 MG PO TABS: 150.0000 mg | ORAL_TABLET | Freq: Two times a day (BID) | ORAL | 0 refills | Status: DC

## 2018-07-13 MED ORDER — DEXTROSE 5 % IN LACTATED RINGERS IV BOLUS
1000.0000 mL | Freq: Once | INTRAVENOUS | Status: AC
  Administered 2018-07-13: 1000 mL via INTRAVENOUS

## 2018-07-13 MED ORDER — M.V.I. ADULT IV INJ
Freq: Once | INTRAVENOUS | Status: AC
  Administered 2018-07-13: 19:00:00 via INTRAVENOUS
  Filled 2018-07-13: qty 1000

## 2018-07-13 MED ORDER — PROMETHAZINE HCL 25 MG PO TABS: 12.5000 mg | ORAL_TABLET | Freq: Four times a day (QID) | ORAL | 0 refills | Status: DC | PRN

## 2018-07-13 MED ORDER — FAMOTIDINE IN NACL 20-0.9 MG/50ML-% IV SOLN
20.0000 mg | Freq: Once | INTRAVENOUS | Status: AC
  Administered 2018-07-13: 20 mg via INTRAVENOUS
  Filled 2018-07-13: qty 50

## 2018-07-13 MED ORDER — PROMETHAZINE HCL 25 MG/ML IJ SOLN
25.0000 mg | Freq: Once | INTRAMUSCULAR | Status: AC
  Administered 2018-07-13: 25 mg via INTRAVENOUS
  Filled 2018-07-13: qty 1

## 2018-07-13 NOTE — Discharge Instructions (Signed)
Nausea and Vomiting, Adult Feeling sick to your stomach (nausea) means that your stomach is upset or you feel like you have to throw up (vomit). Feeling more and more sick to your stomach can lead to throwing up. Throwing up happens when food and liquid from your stomach are thrown up and out the mouth. Throwing up can make you feel weak and cause you to get dehydrated. Dehydration can make you tired and thirsty, make you have a dry mouth, and make it so you pee (urinate) less often. Older adults and people with other diseases or a weak defense system (immune system) are at higher risk for dehydration. If you feel sick to your stomach or if you throw up, it is important to follow instructions from your doctor about how to take care of yourself. Follow these instructions at home: Eating and drinking Follow these instructions as told by your doctor:  Take an oral rehydration solution (ORS). This is a drink that is sold at pharmacies and stores.  Drink clear fluids in small amounts as you are able, such as: ? Water. ? Ice chips. ? Diluted fruit juice. ? Low-calorie sports drinks.  Eat bland, easy-to-digest foods in small amounts as you are able, such as: ? Bananas. ? Applesauce. ? Rice. ? Low-fat (lean) meats. ? Toast. ? Crackers.  Avoid fluids that have a lot of sugar or caffeine in them.  Avoid alcohol.  Avoid spicy or fatty foods.  General instructions  Drink enough fluid to keep your pee (urine) clear or pale yellow.  Wash your hands often. If you cannot use soap and water, use hand sanitizer.  Make sure that all people in your home wash their hands well and often.  Take over-the-counter and prescription medicines only as told by your doctor.  Rest at home while you get better.  Watch your condition for any changes.  Breathe slowly and deeply when you feel sick to your stomach.  Keep all follow-up visits as told by your doctor. This is important. Contact a doctor  if:  You have a fever.  You cannot keep fluids down.  Your symptoms get worse.  You have new symptoms.  You feel sick to your stomach for more than two days.  You feel light-headed or dizzy.  You have a headache.  You have muscle cramps. Get help right away if:  You have pain in your chest, neck, arm, or jaw.  You feel very weak or you pass out (faint).  You throw up again and again.  You see blood in your throw-up.  Your throw-up looks like black coffee grounds.  You have bloody or black poop (stools) or poop that look like tar.  You have a very bad headache, a stiff neck, or both.  You have a rash.  You have very bad pain, cramping, or bloating in your belly (abdomen).  You have trouble breathing.  You are breathing very quickly.  Your heart is beating very quickly.  Your skin feels cold and clammy.  You feel confused.  You have pain when you pee.  You have signs of dehydration, such as: ? Dark pee, hardly any pee, or no pee. ? Cracked lips. ? Dry mouth. ? Sunken eyes. ? Sleepiness. ? Weakness. These symptoms may be an emergency. Do not wait to see if the symptoms will go away. Get medical help right away. Call your local emergency services (911 in the [place] Do not drive yourself to the hospital. This information is   not intended to replace advice given to you by your health care provider. Make sure you discuss any questions you have with your health care provider. Document Released: 01/13/2008 Document Revised: 02/14/2016 Document Reviewed: 04/02/2015 Elsevier Interactive Patient Education  2018 Elsevier Inc.  

## 2018-07-13 NOTE — MAU Provider Note (Signed)
History     CSN: [id number]  Arrival date and time: 07/13/18 1429   First Provider Initiated Contact with Patient 07/13/18 1624      Chief Complaint  Patient presents with  . Emesis  . Nausea  . Possible Pregnancy   HPI   Ms.Carrie Peterson is a 22 y.o. female 243P2002 @ 3646w5d here in MAU with nausea, vomiting. Says she has been unable to keep down anything. She does not have medication at home for the symptoms. She is scheduled to see Dr. Henderson CloudHorvath on Wednesday for her first prenatal visit. She denies abdominal pain or vaginal bleeding.   OB History    Gravida  3   Para  2   Term  2   Preterm      AB      Living  2     SAB      TAB      Ectopic      Multiple      Live Births  2           Past Medical History:  Diagnosis Date  . Anxiety   . Depression   . Fracture 02/13/2014   Back, 5 years ago.  . Medical history non-contributory   . PTSD (post-traumatic stress disorder)   . Ulcer     Past Surgical History:  Procedure Laterality Date  . adnoids    . TONSILLECTOMY      Family History  Problem Relation Age of Onset  . Alcohol abuse Father   . Anxiety disorder Father   . Bipolar disorder Father   . Depression Father   . Drug abuse Father   . Diabetes Father   . Hypertension Father   . Depression Mother   . Multiple sclerosis Mother   . COPD Maternal Uncle   . Cancer Maternal Grandmother   . COPD Maternal Grandmother   . Arthritis Neg Hx   . Asthma Neg Hx   . Birth defects Neg Hx   . Early death Neg Hx   . Hearing loss Neg Hx   . Heart disease Neg Hx   . Kidney disease Neg Hx   . Hyperlipidemia Neg Hx   . Learning disabilities Neg Hx   . Mental illness Neg Hx   . Mental retardation Neg Hx   . Miscarriages / Stillbirths Neg Hx   . Stroke Neg Hx   . Vision loss Neg Hx     Social History   Tobacco Use  . Smoking status: Current Every Day Smoker    Packs/day: 0.50    Types: Cigarettes    Last attempt to quit: 03/12/2013    Years  since quitting: 5.3  . Smokeless tobacco: Never Used  Substance Use Topics  . Alcohol use: No  . Drug use: No    Allergies: No Known Allergies  Medications Prior to Admission  Medication Sig Dispense Refill Last Dose  . ibuprofen (ADVIL,MOTRIN) 600 MG tablet Take 1 tablet (600 mg total) by mouth every 6 (six) hours as needed. 30 tablet 1   . oxyCODONE-acetaminophen (PERCOCET/ROXICET) 5-325 MG tablet Take 1-2 tablets by mouth every 6 (six) hours as needed. 10 tablet 0   . Prenatal Vit-Fe Fumarate-FA (PRENATAL VITAMIN PLUS LOW IRON) 27-1 MG TABS Please specify directions, refills and quantity 1 tablet 0    Results for orders placed or performed during the hospital encounter of 07/13/18 (from the past 48 hour(s))  Urinalysis, Routine w reflex microscopic  Status: Abnormal   Collection Time: 07/13/18  3:00 PM  Result Value Ref Range   Color, Urine YELLOW YELLOW   APPearance HAZY (A) CLEAR   Specific Gravity, Urine 1.020 1.005 - 1.030   pH 8.0 5.0 - 8.0   Glucose, UA NEGATIVE NEGATIVE mg/dL   Hgb urine dipstick NEGATIVE NEGATIVE   Bilirubin Urine NEGATIVE NEGATIVE   Ketones, ur 80 (A) NEGATIVE mg/dL   Protein, ur NEGATIVE NEGATIVE mg/dL   Nitrite NEGATIVE NEGATIVE   Leukocytes, UA TRACE (A) NEGATIVE   RBC / HPF 0-5 0 - 5 RBC/hpf   WBC, UA 21-50 0 - 5 WBC/hpf   Bacteria, UA NONE SEEN NONE SEEN   Squamous Epithelial / LPF 11-20 0 - 5   Mucus PRESENT     Comment: Performed at [facility], [address] [place], [place] [address]  Pregnancy, urine POC     Status: Abnormal   Collection Time: 07/13/18  3:04 PM  Result Value Ref Range   Preg Test, Ur POSITIVE (A) NEGATIVE    Comment:        THE SENSITIVITY OF THIS METHODOLOGY IS >24 mIU/mL    Review of Systems  Constitutional: Positive for fatigue. Negative for fever.  Gastrointestinal: Positive for diarrhea and nausea. Negative for abdominal pain.  Genitourinary: Negative for vaginal bleeding and vaginal discharge.    Physical Exam   Blood pressure (!) 120/50, pulse 77, temperature 98.7 F (37.1 C), temperature source Oral, resp. rate 17, height 5\' 6"  (1.676 m), weight 62.6 kg, last menstrual period 06/10/2018, SpO2 100 %.  Physical Exam  Constitutional: She is oriented to person, place, and time. She appears well-developed and well-nourished.  Non-toxic appearance. She has a sickly appearance.  Eyes: Pupils are equal, round, and reactive to light.  Neck: Neck supple.  Respiratory: Effort normal.  Musculoskeletal: Normal range of motion.  Neurological: She is alert and oriented to person, place, and time.  Skin: Skin is warm. She is not diaphoretic.  Psychiatric: Her behavior is normal.   MAU Course  Procedures  None  MDM  Urine shows > 80 ketones  D5LR bolus x 1 MVI bolus X 1 Phenergan 25 mg IV & Pepcid 20 mg  IV Patient feeling much better, not vomiting since medications. She is tolerating oral fluids now.   Assessment and Plan   A:  1. Nausea and vomiting in pregnancy   2. Dehydration, moderate     P:  Discharge home in stable condition Keep your scheduled visit with [facility]  Return to MAU if symptoms worsen Rx: Phenergan, Zantac Small, frequent meals  , Harolyn Rutherford, NP 07/13/2018 8:05 PM

## 2018-07-13 NOTE — MAU Note (Signed)
Pt signed paper copy AVS, signature pad not working in room

## 2018-07-13 NOTE — MAU Note (Signed)
Pt reports she is pregnant and she has vomited everything she has tried to eat or drink since yesterday.

## 2018-07-17 LAB — CULTURE, OB URINE
Culture: >=100000 — AB
SPECIAL REQUESTS: NORMAL

## 2018-07-21 DIAGNOSIS — Z3480 Encounter for supervision of other normal pregnancy, unspecified trimester: Secondary | ICD-10-CM | POA: Diagnosis not present

## 2018-07-21 DIAGNOSIS — O26849 Uterine size-date discrepancy, unspecified trimester: Secondary | ICD-10-CM | POA: Diagnosis not present

## 2018-07-21 DIAGNOSIS — R87610 Atypical squamous cells of undetermined significance on cytologic smear of cervix (ASC-US): Secondary | ICD-10-CM | POA: Diagnosis not present

## 2018-07-21 DIAGNOSIS — Z113 Encounter for screening for infections with a predominantly sexual mode of transmission: Secondary | ICD-10-CM | POA: Diagnosis not present

## 2018-07-21 DIAGNOSIS — Z124 Encounter for screening for malignant neoplasm of cervix: Secondary | ICD-10-CM | POA: Diagnosis not present

## 2018-07-21 LAB — OB RESULTS CONSOLE GC/CHLAMYDIA
Chlamydia: POSITIVE
Gonorrhea: NEGATIVE

## 2018-08-10 NOTE — L&D Delivery Note (Signed)
Delivery Note At 10:09 PM a viable and healthy female was delivered via Vaginal, Spontaneous (Presentation: Right Occiput anterior  ).  APGAR: 9, 9; weight pending .   Placenta status: spontaneous, intact.  Cord: 3V with nuchal cord x 1  Anesthesia:  Epidural Episiotomy: None Lacerations: None Suture Repair: NA Est. Blood Loss (mL): 90  Mom to postpartum.  Baby to Couplet care / Skin to Skin.  [name] 03/06/2019, 10:21 PM

## 2018-08-19 ENCOUNTER — Encounter (HOSPITAL_COMMUNITY): Payer: Self-pay

## 2018-08-19 ENCOUNTER — Inpatient Hospital Stay (HOSPITAL_COMMUNITY)
Admission: AD | Admit: 2018-08-19 | Discharge: 2018-08-19 | Disposition: A | Discharge status: Home / Self Care | Payer: Medicaid Other | Attending: Obstetrics and Gynecology | Admitting: Obstetrics and Gynecology

## 2018-08-19 DIAGNOSIS — O99331 Smoking (tobacco) complicating pregnancy, first trimester: Secondary | ICD-10-CM | POA: Diagnosis not present

## 2018-08-19 DIAGNOSIS — Z3A11 11 weeks gestation of pregnancy: Secondary | ICD-10-CM | POA: Diagnosis not present

## 2018-08-19 DIAGNOSIS — O210 Mild hyperemesis gravidarum: Secondary | ICD-10-CM | POA: Insufficient documentation

## 2018-08-19 DIAGNOSIS — F17210 Nicotine dependence, cigarettes, uncomplicated: Secondary | ICD-10-CM | POA: Diagnosis not present

## 2018-08-19 LAB — COMPREHENSIVE METABOLIC PANEL
ALT: 36 U/L (ref 0–44)
AST: 23 U/L (ref 15–41)
Albumin: 3.6 g/dL (ref 3.5–5.0)
Alkaline Phosphatase: 82 U/L (ref 38–126)
Anion gap: 10 (ref 5–15)
BUN: 10 mg/dL (ref 6–20)
CHLORIDE: 102 mmol/L (ref 98–111)
CO2: 22 mmol/L (ref 22–32)
CREATININE: 0.55 mg/dL (ref 0.44–1.00)
Calcium: 9.3 mg/dL (ref 8.9–10.3)
GFR calc Af Amer: >60 mL/min (ref >60)
GFR calc non Af Amer: >60 mL/min (ref >60)
Glucose, Bld: 106 mg/dL — ABNORMAL HIGH (ref 70–99)
POTASSIUM: 3.8 mmol/L (ref 3.5–5.1)
SODIUM: 134 mmol/L — AB (ref 135–145)
Total Bilirubin: 0.9 mg/dL (ref 0.3–1.2)
Total Protein: 6.3 g/dL — ABNORMAL LOW (ref 6.5–8.1)

## 2018-08-19 LAB — URINALYSIS, ROUTINE W REFLEX MICROSCOPIC
Bilirubin Urine: NEGATIVE
Glucose, UA: NEGATIVE mg/dL
Hgb urine dipstick: NEGATIVE
Ketones, ur: 80 mg/dL — AB
Nitrite: NEGATIVE
PROTEIN: 30 mg/dL — AB
Specific Gravity, Urine: 1.024 (ref 1.005–1.030)
pH: 9 — ABNORMAL HIGH (ref 5.0–8.0)

## 2018-08-19 LAB — CBC
HEMATOCRIT: 39.6 % (ref 36.0–46.0)
HEMOGLOBIN: 14.1 g/dL (ref 12.0–15.0)
MCH: 34.6 pg — ABNORMAL HIGH (ref 26.0–34.0)
MCHC: 35.6 g/dL (ref 30.0–36.0)
MCV: 97.1 fL (ref 80.0–100.0)
NRBC: 0 % (ref 0.0–0.2)
Platelets: 278 10*3/uL (ref 150–400)
RBC: 4.08 MIL/uL (ref 3.87–5.11)
RDW: 12.1 % (ref 11.5–15.5)
WBC: 19.5 10*3/uL — ABNORMAL HIGH (ref 4.0–10.5)

## 2018-08-19 MED ORDER — SCOPOLAMINE 1 MG/3DAYS TD PT72
1.0000 | MEDICATED_PATCH | TRANSDERMAL | Status: DC
  Administered 2018-08-19: 1.5 mg via TRANSDERMAL
  Filled 2018-08-19: qty 1

## 2018-08-19 MED ORDER — PROMETHAZINE HCL 25 MG/ML IJ SOLN
25.0000 mg | Freq: Four times a day (QID) | INTRAMUSCULAR | Status: DC | PRN
  Administered 2018-08-19: 25 mg via INTRAVENOUS
  Filled 2018-08-19: qty 1

## 2018-08-19 MED ORDER — FAMOTIDINE IN NACL 20-0.9 MG/50ML-% IV SOLN
20.0000 mg | Freq: Once | INTRAVENOUS | Status: AC
  Administered 2018-08-19: 20 mg via INTRAVENOUS
  Filled 2018-08-19: qty 50

## 2018-08-19 MED ORDER — SODIUM CHLORIDE 0.9 % IV SOLN
8.0000 mg | Freq: Once | INTRAVENOUS | Status: DC
  Filled 2018-08-19: qty 4

## 2018-08-19 MED ORDER — M.V.I. ADULT IV INJ
Freq: Once | INTRAVENOUS | Status: AC
  Administered 2018-08-19: 22:00:00 via INTRAVENOUS
  Filled 2018-08-19: qty 10

## 2018-08-19 MED ORDER — LACTATED RINGERS IV BOLUS: 1000.0000 mL | Freq: Once | INTRAVENOUS | Status: DC

## 2018-08-19 MED ORDER — DEXTROSE 5 % IN LACTATED RINGERS IV BOLUS
1000.0000 mL | Freq: Once | INTRAVENOUS | Status: AC
  Administered 2018-08-19: 1000 mL via INTRAVENOUS

## 2018-08-19 MED ORDER — SCOPOLAMINE 1 MG/3DAYS TD PT72: 1.0000 | MEDICATED_PATCH | TRANSDERMAL | 12 refills | Status: DC

## 2018-08-19 MED ORDER — PROMETHAZINE HCL 25 MG RE SUPP: 25.0000 mg | Freq: Four times a day (QID) | RECTAL | 0 refills | Status: DC | PRN

## 2018-08-19 NOTE — Discharge Instructions (Signed)

## 2018-08-19 NOTE — MAU Provider Note (Addendum)
History     CSN: [id number]  Arrival date and time: 08/19/18 [id number]   First Provider Initiated Contact with Patient 08/19/18 2000      Chief Complaint  Patient presents with  . Emesis   G3P2002 @11 .0 presenting with N/V. Sx have been ongoing through the pregnancy but worsened this am. She is unable to tolerate anything po. No fevers. No sick contacts. No diarrhea. Endorses body cramps and intermittent numbness. Denies abd pain or vaginal bleeding.    OB History    Gravida  3   Para  2   Term  2   Preterm      AB      Living  2     SAB      TAB      Ectopic      Multiple      Live Births  2           Past Medical History:  Diagnosis Date  . Anxiety   . Depression   . Fracture 02/13/2014   Back, 5 years ago.  . Medical history non-contributory   . PTSD (post-traumatic stress disorder)   . Ulcer     Past Surgical History:  Procedure Laterality Date  . adnoids    . TONSILLECTOMY      Family History  Problem Relation Age of Onset  . Alcohol abuse Father   . Anxiety disorder Father   . Bipolar disorder Father   . Depression Father   . Drug abuse Father   . Diabetes Father   . Hypertension Father   . Depression Mother   . Multiple sclerosis Mother   . COPD Maternal Uncle   . Cancer Maternal Grandmother   . COPD Maternal Grandmother   . Arthritis Neg Hx   . Asthma Neg Hx   . Birth defects Neg Hx   . Early death Neg Hx   . Hearing loss Neg Hx   . Heart disease Neg Hx   . Kidney disease Neg Hx   . Hyperlipidemia Neg Hx   . Learning disabilities Neg Hx   . Mental illness Neg Hx   . Mental retardation Neg Hx   . Miscarriages / Stillbirths Neg Hx   . Stroke Neg Hx   . Vision loss Neg Hx     Social History   Tobacco Use  . Smoking status: Current Every Day Smoker    Packs/day: 0.50    Types: Cigarettes    Last attempt to quit: 03/12/2013    Years since quitting: 5.4  . Smokeless tobacco: Never Used  Substance Use Topics  . Alcohol  use: No  . Drug use: No    Allergies: No Known Allergies  Medications Prior to Admission  Medication Sig Dispense Refill Last Dose  . Prenatal Vit-Fe Fumarate-FA (PRENATAL VITAMIN PLUS LOW IRON) 27-1 MG TABS Please specify directions, refills and quantity 1 tablet 0 08/18/2018 at Unknown time  . promethazine (PHENERGAN) 25 MG tablet Take 0.5-1 tablets (12.5-25 mg total) by mouth every 6 (six) hours as needed for nausea or vomiting. 30 tablet 0 08/19/2018 at Unknown time  . ranitidine (ZANTAC) 150 MG tablet Take 1 tablet (150 mg total) by mouth 2 (two) times daily. 60 tablet 0 not taking    Review of Systems  Constitutional: Negative for chills and fever.  Gastrointestinal: Positive for nausea and vomiting. Negative for abdominal pain, constipation and diarrhea.  Genitourinary: Negative for vaginal discharge.  Musculoskeletal: Positive for myalgias.  Physical Exam   Blood pressure 118/63, pulse 96, temperature (!) 97.4 F (36.3 C), temperature source Oral, resp. rate (!) 22, weight 63.6 kg, last menstrual period 06/10/2018.  Physical Exam  Constitutional: She is oriented to person, place, and time. She appears well-developed and well-nourished. No distress.  HENT:  Head: Normocephalic and atraumatic.  Neck: Normal range of motion.  Cardiovascular: Normal rate.  Respiratory: Effort normal. No respiratory distress.  Musculoskeletal: Normal range of motion.  Neurological: She is alert and oriented to person, place, and time.  Psychiatric: She has a normal mood and affect.   Results for orders placed or performed during the hospital encounter of 08/19/18 (from the past 24 hour(s))  Urinalysis, Routine w reflex microscopic     Status: Abnormal   Collection Time: 08/19/18  7:11 PM  Result Value Ref Range   Color, Urine AMBER (A) YELLOW   APPearance HAZY (A) CLEAR   Specific Gravity, Urine 1.024 1.005 - 1.030   pH 9.0 (H) 5.0 - 8.0   Glucose, UA NEGATIVE NEGATIVE mg/dL   Hgb urine  dipstick NEGATIVE NEGATIVE   Bilirubin Urine NEGATIVE NEGATIVE   Ketones, ur 80 (A) NEGATIVE mg/dL   Protein, ur 30 (A) NEGATIVE mg/dL   Nitrite NEGATIVE NEGATIVE   Leukocytes, UA TRACE (A) NEGATIVE   RBC / HPF 0-5 0 - 5 RBC/hpf   WBC, UA 11-20 0 - 5 WBC/hpf   Bacteria, UA RARE (A) NONE SEEN   Squamous Epithelial / LPF 11-20 0 - 5   Mucus PRESENT    MAU Course  Procedures Orders Placed This Encounter  Procedures  . Urinalysis, Routine w reflex microscopic    Standing Status:   Standing    Number of Occurrences:   1  . Comprehensive metabolic panel    Standing Status:   Standing    Number of Occurrences:   1  . CBC    Standing Status:   Standing    Number of Occurrences:   1   Meds ordered this encounter  Medications  . dextrose 5% lactated ringers bolus 1,000 mL  . DISCONTD: lactated ringers bolus 1,000 mL  . famotidine (PEPCID) IVPB 20 mg premix  . DISCONTD: ondansetron (ZOFRAN) 8 mg in sodium chloride 0.9 % 50 mL IVPB  . promethazine (PHENERGAN) injection 25 mg  . multivitamins adult (INFUVITE ADULT) 10 mL in lactated ringers 1,000 mL infusion  . scopolamine (TRANSDERM-SCOP) 1 MG/3DAYS 1.5 mg   MDM Labs and meds ordered.  Transfer of care given to Karn Cassis, [place]  08/19/2018 8:31 PM    Assessment and Plan   2314: Patient has tolerated ice and ginger ale; desires discharge.  She continues to deny abdominal pain, VB, or other ob-gyn complaint.  FHR is 169 by Doppler.   1. Morning sickness    2. Patient stable for discharge with RX for scop patch, phenergan suppositories. Counseled patient that phenergan may make her sleep; try phenergan at night and see how she feels. She can add back in pills during the day slowly.   3. Patient  has follow-up appt on 1/15 at [facility] and can change meds at that visit if necessary.   4. Reviewed eating plan and warning signs of when to return to MAU (can't keep down liquids, severe dehydration, VB,  abdominal pain).   Luna Kitchens  .

## 2018-08-19 NOTE — MAU Note (Signed)
Pt C/O vomiting since 0600 this morning, unable to hold anything down.  Feels like body is going numb, arms & legs are cramping.  Denies diarrhea.  C/O mid abdominal pain, denies bleeding.

## 2018-08-24 DIAGNOSIS — Z3682 Encounter for antenatal screening for nuchal translucency: Secondary | ICD-10-CM | POA: Diagnosis not present

## 2018-08-24 DIAGNOSIS — Z3480 Encounter for supervision of other normal pregnancy, unspecified trimester: Secondary | ICD-10-CM | POA: Diagnosis not present

## 2018-08-24 LAB — OB RESULTS CONSOLE ABO/RH: RH Type: POSITIVE

## 2018-08-24 LAB — OB RESULTS CONSOLE RUBELLA ANTIBODY, IGM: Rubella: IMMUNE

## 2018-08-24 LAB — OB RESULTS CONSOLE HEPATITIS B SURFACE ANTIGEN: Hepatitis B Surface Ag: NEGATIVE

## 2018-08-24 LAB — OB RESULTS CONSOLE ANTIBODY SCREEN: Antibody Screen: NEGATIVE

## 2018-08-24 LAB — OB RESULTS CONSOLE HIV ANTIBODY (ROUTINE TESTING): HIV: NONREACTIVE

## 2018-08-24 LAB — OB RESULTS CONSOLE RPR: RPR: NONREACTIVE

## 2018-09-08 DIAGNOSIS — O209 Hemorrhage in early pregnancy, unspecified: Secondary | ICD-10-CM | POA: Diagnosis not present

## 2018-09-08 LAB — OB RESULTS CONSOLE GC/CHLAMYDIA
Chlamydia: NEGATIVE
Gonorrhea: NEGATIVE

## 2018-09-20 DIAGNOSIS — Z3482 Encounter for supervision of other normal pregnancy, second trimester: Secondary | ICD-10-CM | POA: Diagnosis not present

## 2018-10-03 ENCOUNTER — Inpatient Hospital Stay (HOSPITAL_COMMUNITY)
Admission: AD | Admit: 2018-10-03 | Discharge: 2018-10-03 | Disposition: A | Discharge status: Home / Self Care | Payer: Medicaid Other | Attending: Obstetrics and Gynecology | Admitting: Obstetrics and Gynecology

## 2018-10-03 ENCOUNTER — Encounter (HOSPITAL_COMMUNITY): Payer: Self-pay | Admitting: Student

## 2018-10-03 ENCOUNTER — Other Ambulatory Visit: Payer: Self-pay

## 2018-10-03 ENCOUNTER — Inpatient Hospital Stay (HOSPITAL_COMMUNITY): Payer: Medicaid Other

## 2018-10-03 DIAGNOSIS — F17210 Nicotine dependence, cigarettes, uncomplicated: Secondary | ICD-10-CM | POA: Diagnosis not present

## 2018-10-03 DIAGNOSIS — O99332 Smoking (tobacco) complicating pregnancy, second trimester: Secondary | ICD-10-CM | POA: Diagnosis not present

## 2018-10-03 DIAGNOSIS — O34511 Maternal care for incarceration of gravid uterus, first trimester: Secondary | ICD-10-CM | POA: Diagnosis not present

## 2018-10-03 DIAGNOSIS — Z3A17 17 weeks gestation of pregnancy: Secondary | ICD-10-CM | POA: Diagnosis not present

## 2018-10-03 DIAGNOSIS — O210 Mild hyperemesis gravidarum: Secondary | ICD-10-CM | POA: Insufficient documentation

## 2018-10-03 DIAGNOSIS — O26892 Other specified pregnancy related conditions, second trimester: Secondary | ICD-10-CM | POA: Diagnosis not present

## 2018-10-03 DIAGNOSIS — O219 Vomiting of pregnancy, unspecified: Secondary | ICD-10-CM

## 2018-10-03 DIAGNOSIS — R1011 Right upper quadrant pain: Secondary | ICD-10-CM | POA: Insufficient documentation

## 2018-10-03 DIAGNOSIS — R109 Unspecified abdominal pain: Secondary | ICD-10-CM | POA: Insufficient documentation

## 2018-10-03 LAB — CBC WITH DIFFERENTIAL/PLATELET
Abs Immature Granulocytes: 0.11 10*3/uL — ABNORMAL HIGH (ref 0.00–0.07)
Basophils Absolute: 0 10*3/uL (ref 0.0–0.1)
Basophils Relative: 0 %
Eosinophils Absolute: 0 10*3/uL (ref 0.0–0.5)
Eosinophils Relative: 0 %
HCT: 38 % (ref 36.0–46.0)
Hemoglobin: 13.1 g/dL (ref 12.0–15.0)
Immature Granulocytes: 1 %
Lymphocytes Relative: 12 %
Lymphs Abs: 1.8 10*3/uL (ref 0.7–4.0)
MCH: 33.9 pg (ref 26.0–34.0)
MCHC: 34.5 g/dL (ref 30.0–36.0)
MCV: 98.2 fL (ref 80.0–100.0)
Monocytes Absolute: 0.6 10*3/uL (ref 0.1–1.0)
Monocytes Relative: 4 %
Neutro Abs: 12.2 10*3/uL — ABNORMAL HIGH (ref 1.7–7.7)
Neutrophils Relative %: 83 %
Platelets: 232 10*3/uL (ref 150–400)
RBC: 3.87 MIL/uL (ref 3.87–5.11)
RDW: 12.2 % (ref 11.5–15.5)
WBC: 14.8 10*3/uL — ABNORMAL HIGH (ref 4.0–10.5)
nRBC: 0 % (ref 0.0–0.2)

## 2018-10-03 LAB — URINALYSIS, ROUTINE W REFLEX MICROSCOPIC
BILIRUBIN URINE: NEGATIVE
Glucose, UA: NEGATIVE mg/dL
Hgb urine dipstick: NEGATIVE
KETONES UR: 20 mg/dL — AB
LEUKOCYTE UA: NEGATIVE
NITRITE: NEGATIVE
PH: 8 (ref 5.0–8.0)
PROTEIN: NEGATIVE mg/dL
Specific Gravity, Urine: 1.023 (ref 1.005–1.030)

## 2018-10-03 MED ORDER — PROMETHAZINE HCL 25 MG PO TABS: 25.0000 mg | ORAL_TABLET | Freq: Four times a day (QID) | ORAL | 0 refills | Status: DC | PRN

## 2018-10-03 MED ORDER — PROMETHAZINE HCL 25 MG/ML IJ SOLN
25.0000 mg | Freq: Once | INTRAMUSCULAR | Status: AC
  Administered 2018-10-03: 25 mg via INTRAVENOUS
  Filled 2018-10-03: qty 1

## 2018-10-03 MED ORDER — LACTATED RINGERS IV BOLUS
1000.0000 mL | Freq: Once | INTRAVENOUS | Status: AC
  Administered 2018-10-03: 1000 mL via INTRAVENOUS

## 2018-10-03 MED ORDER — ONDANSETRON 4 MG PO TBDP
8.0000 mg | ORAL_TABLET | Freq: Once | ORAL | Status: AC
  Administered 2018-10-03: 8 mg via ORAL
  Filled 2018-10-03: qty 2

## 2018-10-03 NOTE — Discharge Instructions (Signed)
Warning Signs During Pregnancy  A pregnancy lasts about 40 weeks, starting from the first day of your last period until the baby is born. Pregnancy is divided into three phases called trimesters.  · The first trimester refers to week 1 through week 13 of pregnancy.  · The second trimester is the start of week 14 through the end of week 27.  · The third trimester is the start of week 28 until you deliver your baby.  During each trimester of pregnancy, certain signs and symptoms may indicate a problem. Talk with your health care provider about your current health and any medical conditions you have. Make sure you know the symptoms that you should watch for and report.  How does this affect me?    Warning signs in the first trimester  While some changes during the first trimester may be uncomfortable, most do not represent a serious problem. Let your health care provider know if you have any of the following warning signs in the first trimester:  · You cannot eat or drink without vomiting, and this lasts for longer than a day.  · You have vaginal bleeding or spotting along with menstrual-like cramping.  · You have diarrhea for longer than a day.  · You have a fever or other signs of infection, such as:  ? Pain or burning when you urinate.  ? Foul smelling or thick or yellowish vaginal discharge.  Warning signs in the second trimester  As your baby grows and changes during the second trimester, there are additional signs and symptoms that may indicate a problem. These include:  · Signs and symptoms of infection, including a fever.  · Signs or symptoms of a miscarriage or preterm labor, such as regular contractions, menstrual-like cramping, or lower abdominal pain.  · Bloody or watery vaginal discharge or obvious vaginal bleeding.  · Feeling like your heart is pounding.  · Having trouble breathing.  · Nausea, vomiting, or diarrhea that lasts for longer than a day.  · Craving non-food items, such as clay, chalk, or dirt.  This may be a sign of a very treatable medical condition called pica.  Later in your second trimester, watch for signs and symptoms of a serious medical condition called preeclampsia.These include:  · Changes in your vision.  · A severe headache that does not go away.  · Nausea and vomiting.  It is also important to notice if your baby stops moving or moves less than usual during this time.  Warning signs in the third trimester  As you approach the third trimester, your baby is growing and your body is preparing for the birth of your baby. In your third trimester, be sure to let your health care provider know if:  · You have signs and symptoms of infection, including a fever.  · You have vaginal bleeding.  · You notice that your baby is moving less than usual or is not moving.  · You have nausea, vomiting, or diarrhea that lasts for longer than a day.  · You have a severe headache that does not go away.  · You have vision changes, including seeing spots or having blurry or double vision.  · You have increased swelling in your hands or face.  How does this affect my baby?  Throughout your pregnancy, always report any of the warning signs of a problem to your health care provider. This can help prevent complications that may affect your baby, including:  · Increased risk   for premature birth.  · Infection that may be transmitted to your baby.  · Increased risk for stillbirth.  Contact a health care provider if:  · You have any of the warning signs of a problem for the current trimester of your pregnancy.  · Any of the following apply to you during any trimester of pregnancy:  ? You have strong emotions, such as sadness or anxiety, that interfere with work or personal relationships.  ? You feel unsafe in your home and need help finding a safe place to live.  ? You are using tobacco products, alcohol, or drugs and you need help to stop.  Get help right away if:  You have signs or symptoms of labor before 37 weeks of  pregnancy. These include:  · Contractions that are 5 minutes or less apart, or that increase in frequency, intensity, or length.  · Sudden, sharp abdominal pain or low back pain.  · Uncontrolled gush or trickle of fluid from your vagina.  Summary  · A pregnancy lasts about 40 weeks, starting from the first day of your last period until the baby is born. Pregnancy is divided into three phases called trimesters. Each trimester has warning signs to watch for.  · Always report any warning signs to your health care provider in order to prevent complications that may affect both you and your baby.  · Talk with your health care provider about your current health and any medical conditions you have. Make sure you know the symptoms that you should watch for and report.  This information is not intended to replace advice given to you by your health care provider. Make sure you discuss any questions you have with your health care provider.  Document Released: 05/13/2017 Document Revised: 05/13/2017 Document Reviewed: 05/13/2017  Elsevier Interactive Patient Education © 2019 [employer]

## 2018-10-03 NOTE — MAU Provider Note (Signed)
Chief Complaint: Abdominal Pain and Emesis   First Provider Initiated Contact with Patient 10/03/18 0847     SUBJECTIVE HPI: Carrie Peterson is a 23 y.o. G3P2002 at [redacted]w[redacted]d who presents to Maternity Admissions reporting abdominal pain & n/v. Reports constant abdominal pain since this morning. Pain primarily in RLQ & radiates to RUQ. Nothing makes better or worse. Had n/v earlier in the pregnancy that had resolved until last night. Reports vomiting 9 times since last night & continues to be nauseated. Last BM was this morning but was a small amount. Has been having normal daily BMs.  Denies fever/chills, vaginal bleeding, LOF, dysuria, hematuria, vaginal discharge. Decreased appetite d/t n/v.   Location: abdomen Quality: sharp, throbbing Severity: 6/10 on pain scale Duration: <1 day Timing: constant Modifying factors: nothing makes better or worse Associated signs and symptoms: n/v  Past Medical History:  Diagnosis Date  . Anxiety   . Depression   . Fracture 02/13/2014   Back, 5 years ago.  Marland Kitchen PTSD (post-traumatic stress disorder)   . Ulcer    OB History  Gravida Para Term Preterm AB Living  SAB TAB Ectopic Multiple Live Births          2    # Outcome Date GA Lbr Len/2nd Weight Sex Delivery Anes PTL Lv  3 Current           2 Term 04/11/13 [redacted]w[redacted]d 09:20 / 00:14 4055 g F Vag-Spont EPI  LIV     Birth Comments: wnl  1 Term 2011    F Vag-Spont EPI N LIV   Past Surgical History:  Procedure Laterality Date  . TONSILLECTOMY AND ADENOIDECTOMY     Social History   Socioeconomic History  . Marital status: Single    Spouse name: Not on file  . Number of children: Not on file  . Years of education: Not on file  . Highest education level: Not on file  Occupational History  . Occupation: part time  Social Needs  . Financial resource strain: Not on file  . Food insecurity:    Worry: Not on file    Inability: Not on file  . Transportation needs:    Medical: Not on file    Non-medical: Not on file  Tobacco Use  . Smoking status: Current Every Day Smoker    Packs/day: 0.50    Types: Cigarettes    Last attempt to quit: 03/12/2013    Years since quitting: 5.5  . Smokeless tobacco: Never Used  Substance and Sexual Activity  . Alcohol use: No  . Drug use: No  . Sexual activity: Yes  Lifestyle  . Physical activity:    Days per week: Not on file    Minutes per session: Not on file  . Stress: Not on file  Relationships  . Social connections:    Talks on phone: Not on file    Gets together: Not on file    Attends religious service: Not on file    Active member of club or organization: Not on file    Attends meetings of clubs or organizations: Not on file    Relationship status: Not on file  . Intimate partner violence:    Fear of current or ex partner: Patient refused    Emotionally abused: Patient refused    Physically abused: Patient refused    Forced sexual activity: Patient refused  Other Topics Concern  . Not on file  Social History  Narrative  . Not on file   Family History  Problem Relation Age of Onset  . Alcohol abuse Father   . Anxiety disorder Father   . Bipolar disorder Father   . Depression Father   . Drug abuse Father   . Diabetes Father   . Hypertension Father   . Depression Mother   . Multiple sclerosis Mother   . COPD Maternal Uncle   . Cancer Maternal Grandmother   . COPD Maternal Grandmother   . Arthritis Neg Hx   . Asthma Neg Hx   . Birth defects Neg Hx   . Early death Neg Hx   . Hearing loss Neg Hx   . Heart disease Neg Hx   . Kidney disease Neg Hx   . Hyperlipidemia Neg Hx   . Learning disabilities Neg Hx   . Mental illness Neg Hx   . Mental retardation Neg Hx   . Miscarriages / Stillbirths Neg Hx   . Stroke Neg Hx   . Vision loss Neg Hx    No current facility-administered medications on file prior to encounter.    Current Outpatient Medications on File Prior to Encounter  Medication Sig Dispense Refill  .  Prenatal Vit-Fe Fumarate-FA (PRENATAL VITAMIN PLUS LOW IRON) 27-1 MG TABS Please specify directions, refills and quantity 1 tablet 0  . ranitidine (ZANTAC) 150 MG tablet Take 1 tablet (150 mg total) by mouth 2 (two) times daily. 60 tablet 0   No Known Allergies  I have reviewed patient's Past Medical Hx, Surgical Hx, Family Hx, Social Hx, medications and allergies.   Review of Systems  Constitutional: Negative.   Gastrointestinal: Positive for abdominal pain, nausea and vomiting. Negative for constipation and diarrhea.  Genitourinary: Negative.     OBJECTIVE Patient Vitals for the past 24 hrs:  BP Temp Temp src Pulse Resp SpO2 Weight  10/03/18 1527 (!) 104/49 - - 77 16 - -  10/03/18 0823 (!) 107/49 98.8 F (37.1 C) Oral 95 18 - 65.8 kg   Constitutional: Well-developed, well-nourished female in no acute distress.  Cardiovascular: normal rate & rhythm, no murmur Respiratory: normal rate and effort. Lung sounds clear throughout GI: tender to deep palpation in RLQ & RUQ. Abd soft, Pos BS x 4. No guarding or rebound tenderness MS: Extremities nontender, no edema, normal ROM Neurologic: Alert and oriented x 4.  GU:   Cervix closed/thick. No blood.     LAB RESULTS Results for orders placed or performed during the hospital encounter of 10/03/18 (from the past 24 hour(s))  Urinalysis, Routine w reflex microscopic     Status: Abnormal   Collection Time: 10/03/18  8:42 AM  Result Value Ref Range   Color, Urine AMBER (A) YELLOW   APPearance CLOUDY (A) CLEAR   Specific Gravity, Urine 1.023 1.005 - 1.030   pH 8.0 5.0 - 8.0   Glucose, UA NEGATIVE NEGATIVE mg/dL   Hgb urine dipstick NEGATIVE NEGATIVE   Bilirubin Urine NEGATIVE NEGATIVE   Ketones, ur 20 (A) NEGATIVE mg/dL   Protein, ur NEGATIVE NEGATIVE mg/dL   Nitrite NEGATIVE NEGATIVE   Leukocytes,Ua NEGATIVE NEGATIVE  CBC with Differential/Platelet     Status: Abnormal   Collection Time: 10/03/18  9:42 AM  Result Value Ref Range    WBC 14.8 (H) 4.0 - 10.5 K/uL   RBC 3.87 3.87 - 5.11 MIL/uL   Hemoglobin 13.1 12.0 - 15.0 g/dL   HCT 67.1 24.5 - 80.9 %   MCV 98.2 80.0 - 100.0  fL   MCH 33.9 26.0 - 34.0 pg   MCHC 34.5 30.0 - 36.0 g/dL   RDW 07.6 80.8 - 81.1 %   Platelets 232 150 - 400 K/uL   nRBC 0.0 0.0 - 0.2 %   Neutrophils Relative % 83 %   Neutro Abs 12.2 (H) 1.7 - 7.7 K/uL   Lymphocytes Relative 12 %   Lymphs Abs 1.8 0.7 - 4.0 K/uL   Monocytes Relative 4 %   Monocytes Absolute 0.6 0.1 - 1.0 K/uL   Eosinophils Relative 0 %   Eosinophils Absolute 0.0 0.0 - 0.5 K/uL   Basophils Relative 0 %   Basophils Absolute 0.0 0.0 - 0.1 K/uL   Immature Granulocytes 1 %   Abs Immature Granulocytes 0.11 (H) 0.00 - 0.07 K/uL    IMAGING Mr Pelvis Wo Contrast  Result Date: 10/03/2018 CLINICAL DATA:  Fourteen weeks pregnant, right lower quadrant abdominal pain, evaluate for appendicitis EXAM: MRI ABDOMEN AND PELVIS WITHOUT CONTRAST TECHNIQUE: Multiplanar multisequence MR imaging of the abdomen and pelvis was performed. No intravenous contrast was administered. COMPARISON:  None. FINDINGS: COMBINED FINDINGS FOR BOTH MR ABDOMEN AND PELVIS Lower chest: Lung bases are clear. Hepatobiliary: Liver is within normal limits. Gallbladder is unremarkable. No intrahepatic or extrahepatic ductal dilatation. Pancreas:  Within normal limits. Spleen:  Within normal limits. Adrenals/Urinary Tract:  Adrenal glands are within normal limits. Kidneys are within normal limits.  No hydronephrosis. Bladder is underdistended but unremarkable. Stomach/Bowel: Stomach is within normal limits. No evidence of bowel obstruction. Normal appendix (series 9/image 18). Vascular/Lymphatic:  No evidence of aneurysm. No suspicious abdominopelvic lymphadenopathy. Reproductive: Gravid uterus. Dedicated fetal evaluation was not performed. Left posterior placenta, free of the cervical os. Bilateral ovaries are within normal limits. Other:  No abdominopelvic ascites.  Musculoskeletal: No focal osseous lesions. IMPRESSION: No evidence of appendicitis. Gravid uterus.  Left posterior placenta, free of the cervical os. Otherwise negative MRI abdomen/pelvis. Electronically Signed   By: Charline Bills M.D.   On: 10/03/2018 14:50   Mr Abdomen Wo Contrast  Result Date: 10/03/2018 CLINICAL DATA:  Fourteen weeks pregnant, right lower quadrant abdominal pain, evaluate for appendicitis EXAM: MRI ABDOMEN AND PELVIS WITHOUT CONTRAST TECHNIQUE: Multiplanar multisequence MR imaging of the abdomen and pelvis was performed. No intravenous contrast was administered. COMPARISON:  None. FINDINGS: COMBINED FINDINGS FOR BOTH MR ABDOMEN AND PELVIS Lower chest: Lung bases are clear. Hepatobiliary: Liver is within normal limits. Gallbladder is unremarkable. No intrahepatic or extrahepatic ductal dilatation. Pancreas:  Within normal limits. Spleen:  Within normal limits. Adrenals/Urinary Tract:  Adrenal glands are within normal limits. Kidneys are within normal limits.  No hydronephrosis. Bladder is underdistended but unremarkable. Stomach/Bowel: Stomach is within normal limits. No evidence of bowel obstruction. Normal appendix (series 9/image 18). Vascular/Lymphatic:  No evidence of aneurysm. No suspicious abdominopelvic lymphadenopathy. Reproductive: Gravid uterus. Dedicated fetal evaluation was not performed. Left posterior placenta, free of the cervical os. Bilateral ovaries are within normal limits. Other:  No abdominopelvic ascites. Musculoskeletal: No focal osseous lesions. IMPRESSION: No evidence of appendicitis. Gravid uterus.  Left posterior placenta, free of the cervical os. Otherwise negative MRI abdomen/pelvis. Electronically Signed   By: Charline Bills M.D.   On: 10/03/2018 14:50    MAU COURSE Orders Placed This Encounter  Procedures  . MR ABDOMEN WO CONTRAST  . MR PELVIS WO CONTRAST  . Urinalysis, Routine w reflex microscopic  . CBC with Differential/Platelet  . Discharge  patient   Meds ordered this encounter  Medications  .  ondansetron (ZOFRAN-ODT) disintegrating tablet 8 mg  . lactated ringers bolus 1,000 mL  . promethazine (PHENERGAN) injection 25 mg  . promethazine (PHENERGAN) 25 MG tablet    Sig: Take 1 tablet (25 mg total) by mouth every 6 (six) hours as needed for nausea or vomiting.    Dispense:  30 tablet    Refill:  0    Order Specific Question:   Supervising Provider    Answer:   Adam Phenix [[id number]]    MDM FHT present via doppler Cervix closed/thick  Patient presents with RLQ pain & n/v. CBC shows leukocytosis with shift. Patient is afebrile. C/w Dr. Debroah Loop & will proceed with MRI to r/o appendicitis.  Patient given zofran without relief of nausea. IV started & given phenergan  Normal MRI. No evidence of appendicitis.   ASSESSMENT 1. Abdominal pain during pregnancy in second trimester   2. Nausea and vomiting during pregnancy prior to [redacted] weeks gestation   3. [redacted] weeks gestation of pregnancy     PLAN Discharge home in stable condition. Discussed reasons to return to MAU Keep f/u with OB Rx phenergan  Follow-up Information    Ob/Gyn, [facility] Follow up.   Contact information: [address] [place] [address] [phone]          Allergies as of 10/03/2018   No Known Allergies     Medication List    STOP taking these medications   scopolamine 1 MG/3DAYS Commonly known as:  TRANSDERM-SCOP     TAKE these medications   PRENATAL VITAMIN PLUS LOW IRON 27-1 MG Tabs Please specify directions, refills and quantity   promethazine 25 MG tablet Commonly known as:  PHENERGAN Take 1 tablet (25 mg total) by mouth every 6 (six) hours as needed for nausea or vomiting. What changed:    how much to take  Another medication with the same name was removed. Continue taking this medication, and follow the directions you see here.   ranitidine 150 MG tablet Commonly known as:  ZANTAC Take 1 tablet (150  mg total) by mouth 2 (two) times daily.        Judeth Horn, NP 10/03/2018  3:57 PM

## 2018-10-03 NOTE — MAU Note (Signed)
Started having sharp pains in RLQ during the, constant. Starts at bottom and goes.  Also unable to keep anything down, started yesterday.   Loose stools for 3 days

## 2018-10-13 DIAGNOSIS — Z3A18 18 weeks gestation of pregnancy: Secondary | ICD-10-CM | POA: Diagnosis not present

## 2018-10-13 DIAGNOSIS — Z363 Encounter for antenatal screening for malformations: Secondary | ICD-10-CM | POA: Diagnosis not present

## 2018-10-13 DIAGNOSIS — O283 Abnormal ultrasonic finding on antenatal screening of mother: Secondary | ICD-10-CM | POA: Diagnosis not present

## 2018-10-13 DIAGNOSIS — O99332 Smoking (tobacco) complicating pregnancy, second trimester: Secondary | ICD-10-CM | POA: Diagnosis not present

## 2018-10-13 DIAGNOSIS — Z36 Encounter for antenatal screening for chromosomal anomalies: Secondary | ICD-10-CM | POA: Diagnosis not present

## 2018-12-14 DIAGNOSIS — Z124 Encounter for screening for malignant neoplasm of cervix: Secondary | ICD-10-CM | POA: Diagnosis not present

## 2018-12-14 DIAGNOSIS — R718 Other abnormality of red blood cells: Secondary | ICD-10-CM | POA: Diagnosis not present

## 2018-12-14 DIAGNOSIS — Z23 Encounter for immunization: Secondary | ICD-10-CM | POA: Diagnosis not present

## 2018-12-14 DIAGNOSIS — R87612 Low grade squamous intraepithelial lesion on cytologic smear of cervix (LGSIL): Secondary | ICD-10-CM | POA: Diagnosis not present

## 2018-12-14 DIAGNOSIS — Z3480 Encounter for supervision of other normal pregnancy, unspecified trimester: Secondary | ICD-10-CM | POA: Diagnosis not present

## 2019-02-02 ENCOUNTER — Encounter (HOSPITAL_COMMUNITY): Payer: Self-pay | Admitting: Advanced Practice Midwife

## 2019-02-02 ENCOUNTER — Inpatient Hospital Stay (HOSPITAL_COMMUNITY)
Admission: AD | Admit: 2019-02-02 | Discharge: 2019-02-03 | Disposition: A | Discharge status: Home / Self Care | Payer: Medicaid Other | Attending: Obstetrics | Admitting: Obstetrics

## 2019-02-02 ENCOUNTER — Other Ambulatory Visit: Payer: Self-pay

## 2019-02-02 DIAGNOSIS — O479 False labor, unspecified: Secondary | ICD-10-CM

## 2019-02-02 DIAGNOSIS — Z3A35 35 weeks gestation of pregnancy: Secondary | ICD-10-CM | POA: Diagnosis not present

## 2019-02-02 DIAGNOSIS — O4700 False labor before 37 completed weeks of gestation, unspecified trimester: Secondary | ICD-10-CM

## 2019-02-02 DIAGNOSIS — O4703 False labor before 37 completed weeks of gestation, third trimester: Secondary | ICD-10-CM | POA: Diagnosis present

## 2019-02-02 DIAGNOSIS — F17210 Nicotine dependence, cigarettes, uncomplicated: Secondary | ICD-10-CM | POA: Insufficient documentation

## 2019-02-02 DIAGNOSIS — O99333 Smoking (tobacco) complicating pregnancy, third trimester: Secondary | ICD-10-CM | POA: Insufficient documentation

## 2019-02-02 LAB — URINALYSIS, ROUTINE W REFLEX MICROSCOPIC
Bilirubin Urine: NEGATIVE
Glucose, UA: NEGATIVE mg/dL
Hgb urine dipstick: NEGATIVE
Ketones, ur: NEGATIVE mg/dL
Nitrite: NEGATIVE
Protein, ur: NEGATIVE mg/dL
Specific Gravity, Urine: 1.01 (ref 1.005–1.030)
pH: 7 (ref 5.0–8.0)

## 2019-02-02 MED ORDER — NIFEDIPINE 10 MG PO CAPS
10.0000 mg | ORAL_CAPSULE | ORAL | Status: AC | PRN
  Administered 2019-02-02 – 2019-02-03 (×4): 10 mg via ORAL
  Filled 2019-02-02 (×4): qty 1

## 2019-02-02 NOTE — MAU Provider Note (Signed)
Chief Complaint:  Contractions   First Provider Initiated Contact with Patient 02/02/19 2245      HPI: Carrie Peterson is a 23 y.o. G3P2002 at [id number] presents to maternity admissions reporting preterm uterine contractions since earlier today.  Got stronger so she came in.  Did not call.  .No history of PTL   She reports good fetal movement, denies LOF, vaginal bleeding, vaginal itching/burning, urinary symptoms, h/a, dizziness, n/v, diarrhea, constipation or fever/chills.  She denies headache, visual changes or RUQ abdominal pain.  RN Note: CTX 5 mins apart.  No LOF/VB. + FM.  Reports no history of any preterm issues w/ this or previous pregnancies  Past Medical History: Past Medical History:  Diagnosis Date  . Anxiety   . Depression   . Fracture 02/13/2014   Back, 5 years ago.  Marland Kitchen. PTSD (post-traumatic stress disorder)   . Ulcer     Past obstetric history: OB History  Gravida Para Term Preterm AB Living  3 2 2     2   SAB TAB Ectopic Multiple Live Births          2    # Outcome Date GA Lbr Len/2nd Weight Sex Delivery Anes PTL Lv  3 Current           2 Term 04/11/13 [id number] 09:20 / 00:14 4055 g F Vag-Spont EPI  LIV     Birth Comments: wnl  1 Term 2011    F Vag-Spont EPI N LIV    Past Surgical History: Past Surgical History:  Procedure Laterality Date  . addenoidectomy    . TONSILLECTOMY AND ADENOIDECTOMY      Family History: Family History  Problem Relation Age of Onset  . Alcohol abuse Father   . Anxiety disorder Father   . Bipolar disorder Father   . Depression Father   . Drug abuse Father   . Diabetes Father   . Hypertension Father   . Depression Mother   . Multiple sclerosis Mother   . COPD Maternal Uncle   . Cancer Maternal Grandmother   . COPD Maternal Grandmother   . Arthritis Neg Hx   . Asthma Neg Hx   . Birth defects Neg Hx   . Early death Neg Hx   . Hearing loss Neg Hx   . Heart disease Neg Hx   . Kidney disease Neg Hx   . Hyperlipidemia Neg Hx    . Learning disabilities Neg Hx   . Mental illness Neg Hx   . Mental retardation Neg Hx   . Miscarriages / Stillbirths Neg Hx   . Stroke Neg Hx   . Vision loss Neg Hx     Social History: Social History   Tobacco Use  . Smoking status: Current Every Day Smoker    Packs/day: 0.50    Types: Cigarettes  . Smokeless tobacco: Never Used  Substance Use Topics  . Alcohol use: No  . Drug use: No    Allergies: No Known Allergies  Meds:  Medications Prior to Admission  Medication Sig Dispense Refill Last Dose  . Prenatal Vit-Fe Fumarate-FA (PRENATAL VITAMIN PLUS LOW IRON) 27-1 MG TABS Please specify directions, refills and quantity 1 tablet 0 02/02/2019 at Unknown time  . promethazine (PHENERGAN) 25 MG tablet Take 1 tablet (25 mg total) by mouth every 6 (six) hours as needed for nausea or vomiting. 30 tablet 0 More than a month at Unknown time  . ranitidine (ZANTAC) 150 MG tablet Take 1 tablet (150 mg total)  by mouth 2 (two) times daily. 60 tablet 0 More than a month at Unknown time    I have reviewed patient's Past Medical Hx, Surgical Hx, Family Hx, Social Hx, medications and allergies.   ROS:  Review of Systems  Constitutional: Negative for chills and fever.  Gastrointestinal: Positive for abdominal pain. Negative for constipation, diarrhea and nausea.  Genitourinary: Positive for pelvic pain. Negative for vaginal bleeding.  Musculoskeletal: Negative for back pain.   Other systems negative  Physical Exam   Patient Vitals for the past 24 hrs:  Weight  02/02/19 2217 79.4 kg   Constitutional: Well-developed, well-nourished female in no acute distress.  Cardiovascular: normal rate and rhythm Respiratory: normal effort, clear to auscultation bilaterally GI: Abd soft, non-tender, gravid appropriate for gestational age.   No rebound or guarding. MS: Extremities nontender, no edema, normal ROM Neurologic: Alert and oriented x 4.  GU: Neg CVAT.  PELVIC EXAM:    Dilation:  1 Effacement (%): 30 Station: -3 Presentation: Vertex Exam by:: Hansel Feinstein, CNM  FHT:  Baseline 140 , moderate variability, accelerations present, no decelerations Contractions: Irregular     Labs: Results for orders placed or performed during the hospital encounter of 02/02/19 (from the past 24 hour(s))  Urinalysis, Routine w reflex microscopic     Status: Abnormal   Collection Time: 02/02/19 11:24 PM  Result Value Ref Range   Color, Urine YELLOW YELLOW   APPearance CLOUDY (A) CLEAR   Specific Gravity, Urine 1.010 1.005 - 1.030   pH 7.0 5.0 - 8.0   Glucose, UA NEGATIVE NEGATIVE mg/dL   Hgb urine dipstick NEGATIVE NEGATIVE   Bilirubin Urine NEGATIVE NEGATIVE   Ketones, ur NEGATIVE NEGATIVE mg/dL   Protein, ur NEGATIVE NEGATIVE mg/dL   Nitrite NEGATIVE NEGATIVE   Leukocytes,Ua SMALL (A) NEGATIVE   RBC / HPF 0-5 0 - 5 RBC/hpf   WBC, UA 21-50 0 - 5 WBC/hpf   Bacteria, UA FEW (A) NONE SEEN   Squamous Epithelial / LPF 11-20 0 - 5   Mucus PRESENT    Amorphous Crystal PRESENT    Sperm, UA PRESENT     Imaging:  No results found.  MAU Course/MDM: I have ordered labs and reviewed results. Urine is dilute NST reviewed, reactive with irregular contractions Treatments in MAU included Procardia series  Gave 4 doses of procardia with significant reduction of contractions Cervix did not change over time. .    Assessment: Single intrauterine pregnancy at [redacted]w[redacted]d Preterm uterine contractions No change in cervix, good response to Procardia  Plan: Discharge home Preterm Labor precautions and fetal kick counts Follow up in Office tomorrow for prenatal visits and recheck  Encouraged to return here or to other Urgent Care/ED if she develops worsening of symptoms, increase in pain, fever, or other concerning symptoms.  Pt stable at time of discharge.  Hansel Feinstein CNM, MSN Certified Nurse-Midwife 02/02/2019 10:45 PM

## 2019-02-02 NOTE — MAU Note (Signed)
CTX 5 mins apart.  No LOF/VB. + FM.  Reports no history of any preterm issues w/ this or previous pregnancies.

## 2019-02-03 DIAGNOSIS — Z3A34 34 weeks gestation of pregnancy: Secondary | ICD-10-CM | POA: Diagnosis not present

## 2019-02-03 DIAGNOSIS — O4703 False labor before 37 completed weeks of gestation, third trimester: Secondary | ICD-10-CM | POA: Diagnosis not present

## 2019-02-03 DIAGNOSIS — O479 False labor, unspecified: Secondary | ICD-10-CM | POA: Diagnosis not present

## 2019-02-03 NOTE — Discharge Instructions (Signed)

## 2019-02-14 DIAGNOSIS — Z3493 Encounter for supervision of normal pregnancy, unspecified, third trimester: Secondary | ICD-10-CM | POA: Diagnosis not present

## 2019-02-14 LAB — OB RESULTS CONSOLE GBS: GBS: NEGATIVE

## 2019-02-28 ENCOUNTER — Other Ambulatory Visit: Payer: Self-pay | Admitting: Obstetrics and Gynecology

## 2019-03-06 ENCOUNTER — Inpatient Hospital Stay (HOSPITAL_COMMUNITY): Payer: Medicaid Other | Admitting: Anesthesiology

## 2019-03-06 ENCOUNTER — Encounter (HOSPITAL_COMMUNITY): Payer: Self-pay | Admitting: Obstetrics

## 2019-03-06 ENCOUNTER — Inpatient Hospital Stay (HOSPITAL_COMMUNITY)
Admission: RE | Admit: 2019-03-06 | Discharge: 2019-03-08 | DRG: 807 | Disposition: A | Discharge status: Home / Self Care | Payer: Medicaid Other | Attending: Obstetrics and Gynecology | Admitting: Obstetrics and Gynecology

## 2019-03-06 ENCOUNTER — Other Ambulatory Visit: Payer: Self-pay

## 2019-03-06 DIAGNOSIS — Z1159 Encounter for screening for other viral diseases: Secondary | ICD-10-CM | POA: Diagnosis not present

## 2019-03-06 DIAGNOSIS — F17210 Nicotine dependence, cigarettes, uncomplicated: Secondary | ICD-10-CM | POA: Diagnosis present

## 2019-03-06 DIAGNOSIS — O26893 Other specified pregnancy related conditions, third trimester: Secondary | ICD-10-CM | POA: Diagnosis present

## 2019-03-06 DIAGNOSIS — Z3A39 39 weeks gestation of pregnancy: Secondary | ICD-10-CM | POA: Diagnosis not present

## 2019-03-06 DIAGNOSIS — O99344 Other mental disorders complicating childbirth: Secondary | ICD-10-CM | POA: Diagnosis not present

## 2019-03-06 DIAGNOSIS — O99334 Smoking (tobacco) complicating childbirth: Secondary | ICD-10-CM | POA: Diagnosis present

## 2019-03-06 DIAGNOSIS — F418 Other specified anxiety disorders: Secondary | ICD-10-CM | POA: Diagnosis not present

## 2019-03-06 LAB — CBC
HCT: 38.6 % (ref 36.0–46.0)
Hemoglobin: 13.1 g/dL (ref 12.0–15.0)
MCH: 32 pg (ref 26.0–34.0)
MCHC: 33.9 g/dL (ref 30.0–36.0)
MCV: 94.4 fL (ref 80.0–100.0)
Platelets: 275 10*3/uL (ref 150–400)
RBC: 4.09 MIL/uL (ref 3.87–5.11)
RDW: 13.3 % (ref 11.5–15.5)
WBC: 19.5 10*3/uL — ABNORMAL HIGH (ref 4.0–10.5)
nRBC: 0 % (ref 0.0–0.2)

## 2019-03-06 LAB — SARS CORONAVIRUS 2 BY RT PCR (HOSPITAL ORDER, PERFORMED IN ~~LOC~~ HOSPITAL LAB): SARS Coronavirus 2: NEGATIVE

## 2019-03-06 MED ORDER — EPHEDRINE 5 MG/ML INJ
10.0000 mg | INTRAVENOUS | Status: DC | PRN
  Filled 2019-03-06: qty 10

## 2019-03-06 MED ORDER — ACETAMINOPHEN 325 MG PO TABS: 650.0000 mg | ORAL_TABLET | ORAL | Status: DC | PRN

## 2019-03-06 MED ORDER — LIDOCAINE HCL (PF) 1 % IJ SOLN
INTRAMUSCULAR | Status: DC | PRN
  Administered 2019-03-06: 5 mL via EPIDURAL

## 2019-03-06 MED ORDER — IBUPROFEN 600 MG PO TABS
600.0000 mg | ORAL_TABLET | Freq: Four times a day (QID) | ORAL | Status: DC
  Administered 2019-03-07 – 2019-03-08 (×6): 600 mg via ORAL
  Filled 2019-03-06 (×6): qty 1

## 2019-03-06 MED ORDER — PHENYLEPHRINE 40 MCG/ML (10ML) SYRINGE FOR IV PUSH (FOR BLOOD PRESSURE SUPPORT): 80.0000 ug | PREFILLED_SYRINGE | INTRAVENOUS | Status: DC | PRN

## 2019-03-06 MED ORDER — EPHEDRINE 5 MG/ML INJ: 10.0000 mg | INTRAVENOUS | Status: DC | PRN

## 2019-03-06 MED ORDER — OXYCODONE-ACETAMINOPHEN 5-325 MG PO TABS: 1.0000 | ORAL_TABLET | ORAL | Status: DC | PRN

## 2019-03-06 MED ORDER — SOD CITRATE-CITRIC ACID 500-334 MG/5ML PO SOLN: 30.0000 mL | ORAL | Status: DC | PRN

## 2019-03-06 MED ORDER — LACTATED RINGERS IV SOLN: 500.0000 mL | Freq: Once | INTRAVENOUS | Status: AC

## 2019-03-06 MED ORDER — PHENYLEPHRINE 40 MCG/ML (10ML) SYRINGE FOR IV PUSH (FOR BLOOD PRESSURE SUPPORT)
80.0000 ug | PREFILLED_SYRINGE | INTRAVENOUS | Status: DC | PRN
  Filled 2019-03-06: qty 10

## 2019-03-06 MED ORDER — LACTATED RINGERS IV SOLN
500.0000 mL | Freq: Once | INTRAVENOUS | Status: AC
  Administered 2019-03-06: 500 mL via INTRAVENOUS

## 2019-03-06 MED ORDER — OXYTOCIN 40 UNITS IN NORMAL SALINE INFUSION - SIMPLE MED
2.5000 [IU]/h | INTRAVENOUS | Status: DC
  Filled 2019-03-06: qty 1000

## 2019-03-06 MED ORDER — SODIUM CHLORIDE (PF) 0.9 % IJ SOLN
INTRAMUSCULAR | Status: DC | PRN
  Administered 2019-03-06: 12 mL/h via EPIDURAL

## 2019-03-06 MED ORDER — OXYCODONE-ACETAMINOPHEN 5-325 MG PO TABS: 2.0000 | ORAL_TABLET | ORAL | Status: DC | PRN

## 2019-03-06 MED ORDER — TERBUTALINE SULFATE 1 MG/ML IJ SOLN
0.2500 mg | Freq: Once | INTRAMUSCULAR | Status: DC | PRN
  Filled 2019-03-06: qty 1

## 2019-03-06 MED ORDER — OXYTOCIN BOLUS FROM INFUSION
500.0000 mL | Freq: Once | INTRAVENOUS | Status: AC
  Administered 2019-03-06: 500 mL via INTRAVENOUS

## 2019-03-06 MED ORDER — LACTATED RINGERS IV SOLN
INTRAVENOUS | Status: DC
  Administered 2019-03-06 (×3): via INTRAVENOUS

## 2019-03-06 MED ORDER — LIDOCAINE HCL (PF) 1 % IJ SOLN: 30.0000 mL | INTRAMUSCULAR | Status: DC | PRN

## 2019-03-06 MED ORDER — OXYTOCIN 40 UNITS IN NORMAL SALINE INFUSION - SIMPLE MED
1.0000 m[IU]/min | INTRAVENOUS | Status: DC
  Administered 2019-03-06 (×2): 2 m[IU]/min via INTRAVENOUS

## 2019-03-06 MED ORDER — ONDANSETRON HCL 4 MG/2ML IJ SOLN
4.0000 mg | Freq: Four times a day (QID) | INTRAMUSCULAR | Status: DC | PRN
  Administered 2019-03-06: 4 mg via INTRAVENOUS
  Filled 2019-03-06: qty 2

## 2019-03-06 MED ORDER — DIPHENHYDRAMINE HCL 50 MG/ML IJ SOLN: 12.5000 mg | INTRAMUSCULAR | Status: DC | PRN

## 2019-03-06 MED ORDER — FENTANYL-BUPIVACAINE-NACL 0.5-0.125-0.9 MG/250ML-% EP SOLN: 12.0000 mL/h | EPIDURAL | Status: DC | PRN

## 2019-03-06 MED ORDER — FENTANYL-BUPIVACAINE-NACL 0.5-0.125-0.9 MG/250ML-% EP SOLN
12.0000 mL/h | EPIDURAL | Status: DC | PRN
  Filled 2019-03-06: qty 250

## 2019-03-06 MED ORDER — LACTATED RINGERS IV SOLN: 500.0000 mL | INTRAVENOUS | Status: DC | PRN

## 2019-03-06 NOTE — Anesthesia Procedure Notes (Signed)
Epidural Patient location during procedure: OB Start time: 03/06/2019 11:15 AM End time: 03/06/2019 11:21 AM  Staffing Anesthesiologist: Effie Berkshire, MD Performed: anesthesiologist   Preanesthetic Checklist Completed: patient identified, site marked, surgical consent, pre-op evaluation, timeout performed, IV checked, risks and benefits discussed and monitors and equipment checked  Epidural Patient position: sitting Prep: ChloraPrep Patient monitoring: heart rate, continuous pulse ox and blood pressure Approach: midline Location: L3-L4 Injection technique: LOR saline  Needle:  Needle type: Tuohy  Needle gauge: 17 G Needle length: 9 cm Catheter type: closed end flexible Catheter size: 20 Guage Test dose: negative and 1.5% lidocaine  Assessment Events: blood not aspirated, injection not painful, no injection resistance and no paresthesia  Additional Notes LOR @ 4  Patient identified. Risks/Benefits/Options discussed with patient including but not limited to bleeding, infection, nerve damage, paralysis, failed block, incomplete pain control, headache, blood pressure changes, nausea, vomiting, reactions to medications, itching and postpartum back pain. Confirmed with bedside nurse the patient's most recent platelet count. Confirmed with patient that they are not currently taking any anticoagulation, have any bleeding history or any family history of bleeding disorders. Patient expressed understanding and wished to proceed. All questions were answered. Sterile technique was used throughout the entire procedure. Please see nursing notes for vital signs. Test dose was given through epidural catheter and negative prior to continuing to dose epidural or start infusion. Warning signs of high block given to the patient including shortness of breath, tingling/numbness in hands, complete motor block, or any concerning symptoms with instructions to call for help. Patient was given instructions  on fall risk and not to get out of bed. All questions and concerns addressed with instructions to call with any issues or inadequate analgesia.    Reason for block:procedure for pain

## 2019-03-06 NOTE — H&P (Signed)
Carrie Peterson is a 23 y.o. female presenting for EOL for term  23 yo L2G4010 @ 39+3 presents for EIOL for term. Her pregnancy has been uncomplicated.  OB History    Gravida  3   Para  2   Term  2   Preterm      AB      Living  2     SAB      TAB      Ectopic      Multiple      Live Births  2          Past Medical History:  Diagnosis Date  . Anxiety   . Depression   . Fracture 02/13/2014   Back, 5 years ago.  Marland Kitchen PTSD (post-traumatic stress disorder)   . Ulcer    Past Surgical History:  Procedure Laterality Date  . addenoidectomy    . TONSILLECTOMY AND ADENOIDECTOMY     Family History: family history includes Alcohol abuse in her father; Anxiety disorder in her father; Bipolar disorder in her father; COPD in her maternal grandmother and maternal uncle; Cancer in her maternal grandmother; Depression in her father and mother; Diabetes in her father; Drug abuse in her father; Hypertension in her father; Multiple sclerosis in her mother. Social History:  reports that she has been smoking cigarettes. She has been smoking about 0.50 packs per day. She has never used smokeless tobacco. She reports that she does not drink alcohol or use drugs.     Maternal Diabetes: No Genetic Screening: Normal Maternal Ultrasounds/Referrals: Normal Fetal Ultrasounds or other Referrals:  None Maternal Substance Abuse:  No Significant Maternal Medications:  None Significant Maternal Lab Results:  None Other Comments:  None  ROS History Dilation: 3 Effacement (%): 50 Station: -3 Exam by:: Carrie Peterson RNC Blood pressure 106/61, pulse 93, temperature 97.8 F (36.6 C), temperature source Oral, resp. rate 18, height 5\' 6"  (1.676 m), weight 82.1 kg, last menstrual period 06/10/2018, SpO2 100 %. Exam Physical Exam  Prenatal labs: ABO, Rh: --/--/B POS (07/27 0820) Antibody: NEG (07/27 0820) Rubella: Immune (01/15 0000) RPR: Nonreactive (01/15 0000)  HBsAg: Negative (01/15  0000)  HIV: Non-reactive (01/15 0000)  GBS: Negative (07/07 0000)   Assessment/Plan: 1) Admit 2) Epidural  3) Pit aug 4) AROM when able   Carrie Peterson 03/06/2019, 11:42 AM

## 2019-03-06 NOTE — Progress Notes (Signed)
Patient ID: Carrie Peterson, female   DOB: 24-Mar-1996, 23 y.o.   MRN: [id number]  Comfortable AFVSS FHR 140 reactive Cvx 5/80/-1 tocoQ2-4  COnt pit Light mec FWB reass

## 2019-03-06 NOTE — Anesthesia Preprocedure Evaluation (Signed)
Anesthesia Evaluation  Patient identified by MRN, date of birth, ID band Patient awake    Reviewed: Allergy & Precautions, Patient's Chart, lab work & pertinent test results  Airway Mallampati: I       Dental no notable dental hx.    Pulmonary Current Smoker,    Pulmonary exam normal        Cardiovascular negative cardio ROS Normal cardiovascular exam     Neuro/Psych Anxiety Depression    GI/Hepatic Neg liver ROS,   Endo/Other    Renal/GU      Musculoskeletal   Abdominal Normal abdominal exam  (+)   Peds  Hematology negative hematology ROS (+)   Anesthesia Other Findings   Reproductive/Obstetrics (+) Pregnancy                             Anesthesia Physical Anesthesia Plan  ASA: II  Anesthesia Plan: Epidural   Post-op Pain Management:    Induction:   PONV Risk Score and Plan:   Airway Management Planned:   Additional Equipment: None  Intra-op Plan:   Post-operative Plan:   Informed Consent: I have reviewed the patients History and Physical, chart, labs and discussed the procedure including the risks, benefits and alternatives for the proposed anesthesia with the patient or authorized representative who has indicated his/her understanding and acceptance.       Plan Discussed with:   Anesthesia Plan Comments: (Lab Results      Component                Value               Date                      WBC                      19.5 (H)            03/06/2019                HGB                      13.1                03/06/2019                HCT                      38.6                03/06/2019                MCV                      94.4                03/06/2019                PLT                      275                 03/06/2019            COVID-19 Labs  No results for input(s): DDIMER, FERRITIN, LDH, CRP in the last 72 hours.  Lab Results      Component  Value               Date                      [id number]              NEGATIVE            03/06/2019            )        Anesthesia Quick Evaluation

## 2019-03-07 LAB — CBC
HCT: 34.7 % — ABNORMAL LOW (ref 36.0–46.0)
Hemoglobin: 11.7 g/dL — ABNORMAL LOW (ref 12.0–15.0)
MCH: 32.1 pg (ref 26.0–34.0)
MCHC: 33.7 g/dL (ref 30.0–36.0)
MCV: 95.1 fL (ref 80.0–100.0)
Platelets: 230 10*3/uL (ref 150–400)
RBC: 3.65 MIL/uL — ABNORMAL LOW (ref 3.87–5.11)
RDW: 13.4 % (ref 11.5–15.5)
WBC: 23.6 10*3/uL — ABNORMAL HIGH (ref 4.0–10.5)
nRBC: 0 % (ref 0.0–0.2)

## 2019-03-07 LAB — ABO/RH: ABO/RH(D): B POS

## 2019-03-07 LAB — RPR: RPR Ser Ql: NONREACTIVE

## 2019-03-07 LAB — TYPE AND SCREEN
ABO/RH(D): B POS
Antibody Screen: NEGATIVE

## 2019-03-07 MED ORDER — OXYCODONE-ACETAMINOPHEN 5-325 MG PO TABS: 2.0000 | ORAL_TABLET | ORAL | Status: DC | PRN

## 2019-03-07 MED ORDER — ZOLPIDEM TARTRATE 5 MG PO TABS: 5.0000 mg | ORAL_TABLET | Freq: Every evening | ORAL | Status: DC | PRN

## 2019-03-07 MED ORDER — BENZOCAINE-MENTHOL 20-0.5 % EX AERO
1.0000 "application " | INHALATION_SPRAY | CUTANEOUS | Status: DC | PRN
  Administered 2019-03-07: 1 via TOPICAL
  Filled 2019-03-07: qty 56

## 2019-03-07 MED ORDER — TETANUS-DIPHTH-ACELL PERTUSSIS 5-2.5-18.5 LF-MCG/0.5 IM SUSP: 0.5000 mL | Freq: Once | INTRAMUSCULAR | Status: DC

## 2019-03-07 MED ORDER — SIMETHICONE 80 MG PO CHEW: 80.0000 mg | CHEWABLE_TABLET | ORAL | Status: DC | PRN

## 2019-03-07 MED ORDER — ONDANSETRON HCL 4 MG/2ML IJ SOLN: 4.0000 mg | INTRAMUSCULAR | Status: DC | PRN

## 2019-03-07 MED ORDER — ONDANSETRON HCL 4 MG PO TABS: 4.0000 mg | ORAL_TABLET | ORAL | Status: DC | PRN

## 2019-03-07 MED ORDER — DIPHENHYDRAMINE HCL 25 MG PO CAPS: 25.0000 mg | ORAL_CAPSULE | Freq: Four times a day (QID) | ORAL | Status: DC | PRN

## 2019-03-07 MED ORDER — ACETAMINOPHEN 325 MG PO TABS: 650.0000 mg | ORAL_TABLET | ORAL | Status: DC | PRN

## 2019-03-07 MED ORDER — PRENATAL MULTIVITAMIN CH
1.0000 | ORAL_TABLET | Freq: Every day | ORAL | Status: DC
  Administered 2019-03-07 – 2019-03-08 (×2): 1 via ORAL
  Filled 2019-03-07 (×2): qty 1

## 2019-03-07 MED ORDER — OXYCODONE-ACETAMINOPHEN 5-325 MG PO TABS: 1.0000 | ORAL_TABLET | ORAL | Status: DC | PRN

## 2019-03-07 MED ORDER — DIBUCAINE (PERIANAL) 1 % EX OINT: 1.0000 "application " | TOPICAL_OINTMENT | CUTANEOUS | Status: DC | PRN

## 2019-03-07 MED ORDER — METHYLERGONOVINE MALEATE 0.2 MG PO TABS: 0.2000 mg | ORAL_TABLET | ORAL | Status: DC | PRN

## 2019-03-07 MED ORDER — WITCH HAZEL-GLYCERIN EX PADS: 1.0000 "application " | MEDICATED_PAD | CUTANEOUS | Status: DC | PRN

## 2019-03-07 MED ORDER — SENNOSIDES-DOCUSATE SODIUM 8.6-50 MG PO TABS
2.0000 | ORAL_TABLET | ORAL | Status: DC
  Filled 2019-03-07: qty 2

## 2019-03-07 MED ORDER — METHYLERGONOVINE MALEATE 0.2 MG/ML IJ SOLN: 0.2000 mg | INTRAMUSCULAR | Status: DC | PRN

## 2019-03-07 MED ORDER — COCONUT OIL OIL: 1.0000 "application " | TOPICAL_OIL | Status: DC | PRN

## 2019-03-07 NOTE — Progress Notes (Signed)
PPD#1 Pt without complaints. Lochia mild VSSAF IMP/Doing well Plan/Routine care 

## 2019-03-07 NOTE — Anesthesia Postprocedure Evaluation (Signed)
Anesthesia Post Note  Patient: Carrie Peterson  Procedure(s) Performed: AN AD [place]     Patient location during evaluation: Mother Baby Anesthesia Type: Epidural Level of consciousness: awake and alert Pain management: pain level controlled Vital Signs Assessment: post-procedure vital signs reviewed and stable Respiratory status: spontaneous breathing, nonlabored ventilation and respiratory function stable Cardiovascular status: stable Postop Assessment: no headache, no backache, epidural receding, no apparent nausea or vomiting, patient able to bend at knees, adequate PO intake and able to ambulate Anesthetic complications: no    Last Vitals:  Vitals:   03/07/19 0133 03/07/19 0519  BP: 119/65 113/67  Pulse: 76 79  Resp: 20 16  Temp: 36.9 C 36.8 C  SpO2: 99% 100%    Last Pain:  Vitals:   03/07/19 0710  TempSrc:   PainSc: 5    Pain Goal:                   Jabier Mutton

## 2019-03-07 NOTE — Lactation Note (Signed)
This note was copied from a baby's chart. Lactation Consultation Note  Patient Name: Carrie Peterson [id number] Date: 03/07/2019 Reason for consult: Initial assessment;Term  P3 mother whose infant is now 36 hours old.  Mother breast fed her other two children for "a couple months" each.  Baby was fussy when I arrived.  Father was awake and in the bed and mother was standing at the bedside attempting to latch baby without success.  Offered to have mother sit down for better positioning and comfort and she politely refused saying, "I have been sitting all day."  Baby continued to be agitated but mother did not want any assistance.  Suggested she hand express but was unable to obtain drops at this time.  Colostrum container provided and milk storage times reviewed.  Since mother did not want any assistance and baby remained fussy and irritable I suggested burping/swaddling/STS.  I told her I would give her a chance to comfort her baby without interference from me.  Mother can call me for assistance as needed.  Mother verbalized understanding.     Maternal Data Formula Feeding for Exclusion: No Has patient been taught Hand Expression?: Yes Does the patient have breastfeeding experience prior to this delivery?: Yes  Feeding Feeding Type: Breast Fed  LATCH Score Latch: Repeated attempts needed to sustain latch, nipple held in mouth throughout feeding, stimulation needed to elicit sucking reflex.  Audible Swallowing: None  Type of Nipple: Everted at rest and after stimulation  Comfort (Breast/Nipple): Soft / non-tender  Hold (Positioning): Assistance needed to correctly position infant at breast and maintain latch.  LATCH Score: 6  Interventions    Lactation Tools Discussed/Used     Consult Status Consult Status: Follow-up Date: 03/08/19 Follow-up type: In-patient    Charlina Dwight R Selisa Tensley 03/07/2019, 11:42 AM

## 2019-03-07 NOTE — Clinical Social Work Maternal (Signed)
  CLINICAL SOCIAL WORK MATERNAL/CHILD NOTE  Patient Details  Name: Carrie Peterson MRN: [id number] Date of Birth: 12/31/1995  Date:  03/07/2019  Clinical Social Worker Initiating Note:  Carrie Faux, LCSW Date/Time: Initiated:  03/07/19/0145     Child's Name:  Carrie Peterson   Biological Parents:  Mother, Father   Need for Interpreter:  None   Reason for Referral:  Behavioral Health Concerns(hx of Bipolar.)   Address:  [address] [place] [address]    Phone number:  [phone] (home)     Additional phone number: none   Household Members/Support Persons (HM/SP):   Household Member/Support Person 2, Household Member/Support Person 3   HM/SP Name Relationship DOB or Age  HM/SP -1   Carrie Peterson (MOB)  MOB   05/12/1994  HM/SP -2 Carrie Peterson (Sibling/daughter) sibling/daughter  04/11/2013  HM/SP -3 Carrie Peterson( FOB)  FOB   10/22/1997  HM/SP -4    Carrie Peterson (Sibiling/daughter)   sibling/daughter   03/24/2010  HM/SP -5        HM/SP -6        HM/SP -7        HM/SP -8          Natural Supports (not living in the home):  Parent   Professional Supports: None   Employment: Unemployed   Type of Work: none   Education:  High school graduate   Homebound arranged:  n/a  Financial Resources:  Medicaid   Other Resources:  WIC, Food Stamps    Cultural/Religious Considerations Which May Impact Care:  none reported.   Strengths:  Ability to meet basic needs , Compliance with medical plan , Home prepared for child    Psychotropic Medications:      None    Pediatrician:     [facility]  Pediatrician List:   [place]    [place]    [place]    [place]    [place]    [place]      Pediatrician Fax Number:    Risk Factors/Current Problems:  None   Cognitive State:  Alert , Able to Concentrate , Insightful    Mood/Affect:  Interested , Relaxed    CSW Assessment: CSW consulted as MOB has history of anxiety,  depression and Bipolar. CSW met with MOB at bedside to discuss further needs.   CSW congratulated MOB on the birth of infant and advised MOB of the reason for the visit. CSW was advised by MOB that she was diagnosed with anxiety and depression 5 years ago. Per MOB she was diagnosed with Bipolar three years ago. MOB denies being on any medication at this time for either and reports that she has been feeling fine since being off her medications. MOB reports that she is not in therapy and declined resources for therapy at this time.   MOB has supports from her family as well as FOB. MOB was provided education on PPD and SIDS. MOB was also given PPD Checklist to track feelings related to PPD. MOB reports that she has all needed items to care for infant with no further needs at this time.   CSW Plan/Description:  No Further Intervention Required/No Barriers to Discharge, Sudden Infant Death Syndrome (SIDS) Education, Perinatal Mood and Anxiety Disorder (PMADs) Education    Carrie Peterson S Carrie Peterson, LCSWA 03/07/2019, 2:03 PM 

## 2019-03-08 NOTE — Lactation Note (Signed)
This note was copied from a baby's chart. Lactation Consultation Note  Patient Name: Carrie Peterson [id number] Date: 03/08/2019 Reason for consult: Follow-up assessment;Term  P3 mother whose infant is now 75 hours old.  Mother breast fed her other two children for "a couple months" each.  Mother has now chosen to supplement with formula.  She began last night and was offered lactation help but denied help.  Baby was asleep when I arrived.  Mother had no questions/concerns related to breast feeding.  She did not desire to review engorgement prevention/treatment but requested a manual pump for home use.  I provided the pump but mother did not wish to review set up, fit or cleaning.  She has our OP phone number for questions/concerns after discharge.   Baby has already been discharged and mother awaiting the OB discharge order.  She is ready to go home.  Father present.   Maternal Data Formula Feeding for Exclusion: Yes Reason for exclusion: Mother's choice to formula and breast feed on admission Has patient been taught Hand Expression?: Yes Does the patient have breastfeeding experience prior to this delivery?: Yes  Feeding Feeding Type: Breast Fed Nipple Type: Slow - flow  LATCH Score                   Interventions    Lactation Tools Discussed/Used Pump Review: (Mother did not wish to review)   Consult Status Consult Status: Complete Date: 03/08/19 Follow-up type: Call as needed    Laylynn Campanella R Pinky Ravan 03/08/2019, 9:28 AM

## 2019-03-08 NOTE — Discharge Summary (Signed)
Obstetric Discharge Summary Reason for Admission: induction of labor and elective Prenatal Procedures: ultrasound Intrapartum Procedures: spontaneous vaginal delivery Postpartum Procedures: none Complications-Operative and Postpartum: none Hemoglobin  Date Value Ref Range Status  03/07/2019 11.7 (L) 12.0 - 15.0 g/dL Final   HCT  Date Value Ref Range Status  03/07/2019 34.7 (L) 36.0 - 46.0 % Final    Physical Exam:  General: alert and cooperative Lochia: appropriate Uterine Fundus: firm DVT Evaluation: No evidence of DVT seen on physical exam.  Discharge Diagnoses: Term Pregnancy-delivered  Discharge Information: Date: 03/08/2019 Activity: pelvic rest Diet: routine Medications: None and Ibuprofen Condition: stable Instructions: refer to practice specific booklet Discharge to: home Follow-up Information    [name], MD Follow up in 4 week(s).   Specialty: Obstetrics and Gynecology Contact information: [place] [address] [phone]           Newborn Data: Live born female  Birth Weight: 6 lb 14.4 oz (3130 g) APGAR: 27, 9  Newborn Delivery   Birth date/time: 03/06/2019 22:09:00 Delivery type: Vaginal, Spontaneous      Home with mother.  Allyn Kenner 03/08/2019, 10:29 AM

## 2019-04-16 IMAGING — MR MR PELVIS W/O CM
5 of 9 series · 19 of 48 positions shown · non-contrast
Comparison: None.

CLINICAL DATA: Fourteen weeks pregnant, right lower quadrant
abdominal pain, evaluate for appendicitis

EXAM:
MRI ABDOMEN AND PELVIS WITHOUT CONTRAST
TECHNIQUE: Multiplanar multisequence MR imaging of the abdomen and pelvis was
performed. No intravenous contrast was administered.

[Series 5: T2 · axial · 5.0mm · 0.74mm/px · z∈[-227,+187]mm · 5 of 70 slices shown (1 of 2)]
[im 1/70]
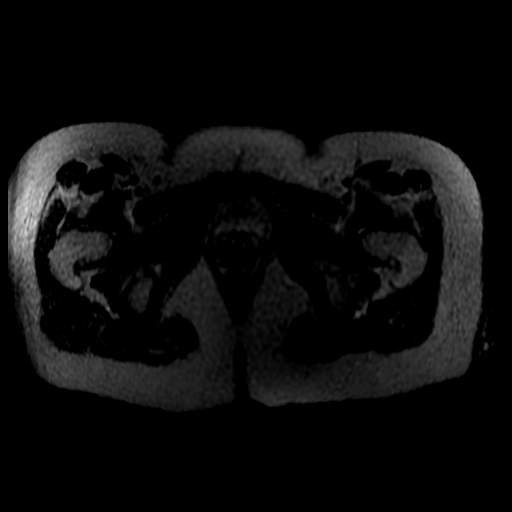
[im 18/70]
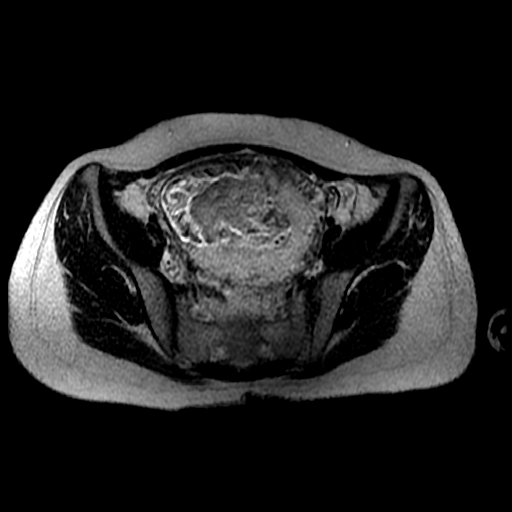
[im 35/70]
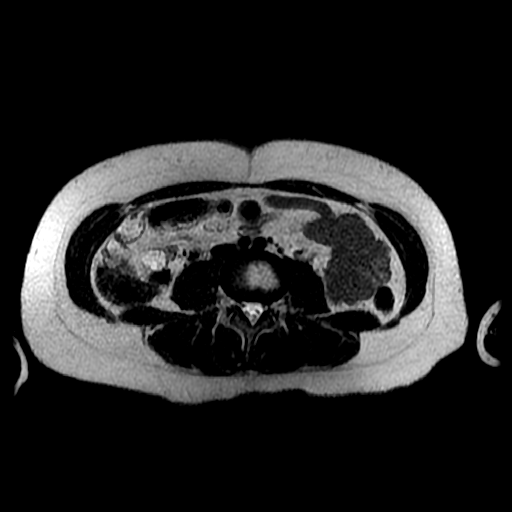
[im 52/70]
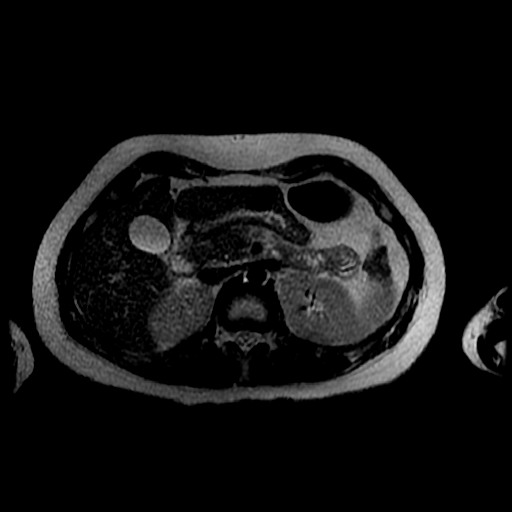
[im 70/70]
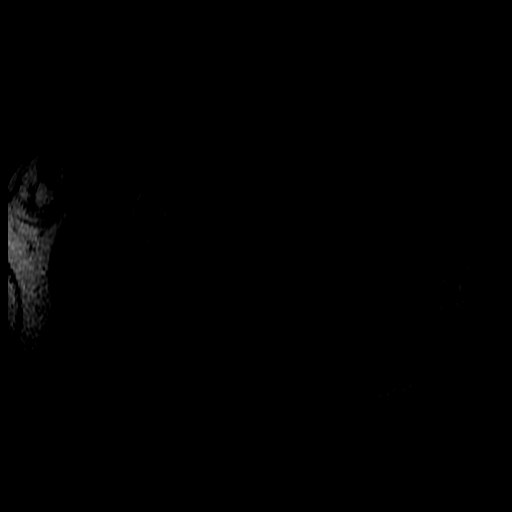

[Series 6: T2 fat-sat · axial · 5.0mm · 0.74mm/px · z∈[-227,+187]mm · 5 of 70 slices shown (1 of 2)]
[im 1/70]
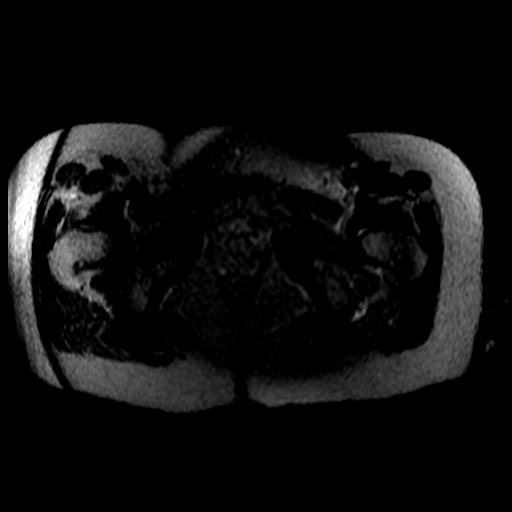
[im 18/70]
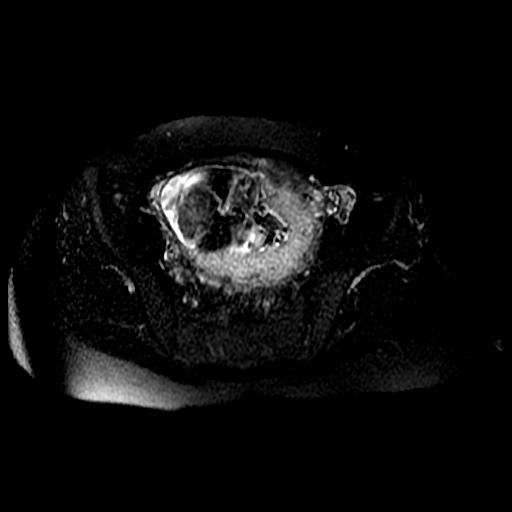
[im 35/70]
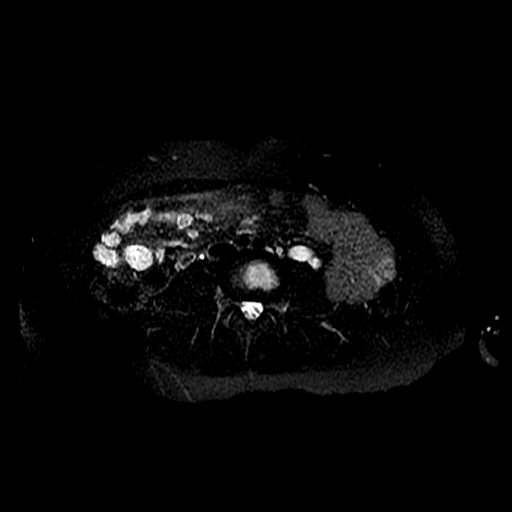
[im 52/70]
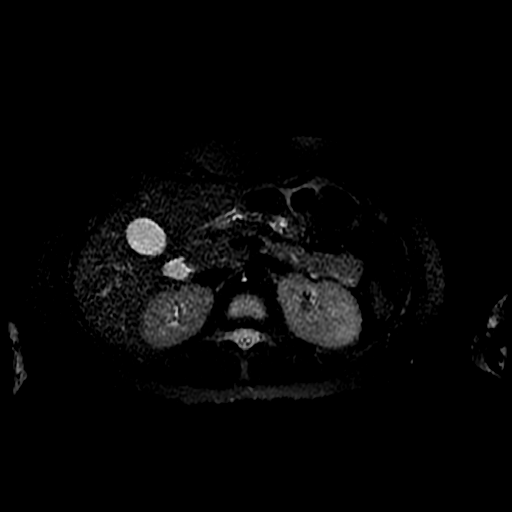
[im 70/70]
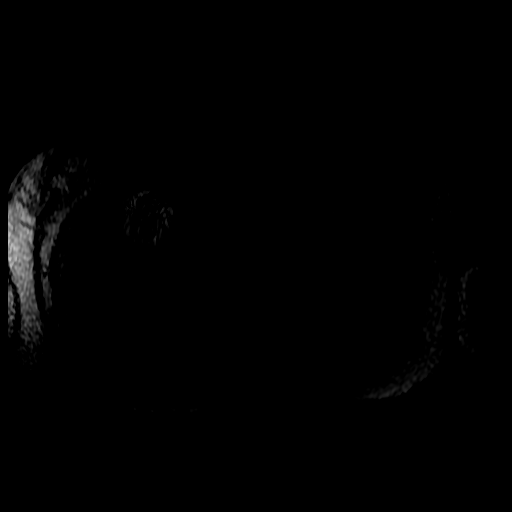

[Series 9: T2 · coronal · 6.0mm · 0.82mm/px · 2 of 26 slices shown (2 of 2)]
[im 1/26]
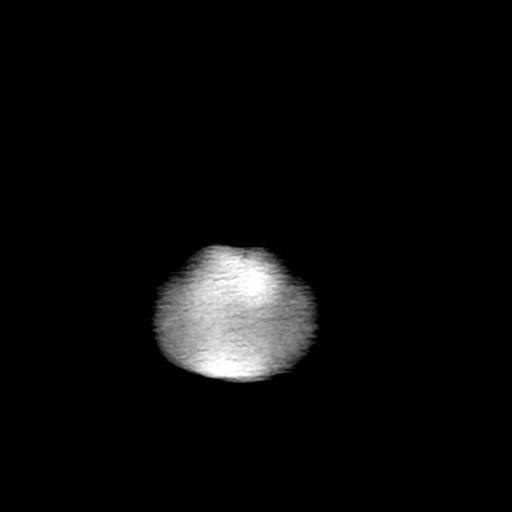
[im 26/26]
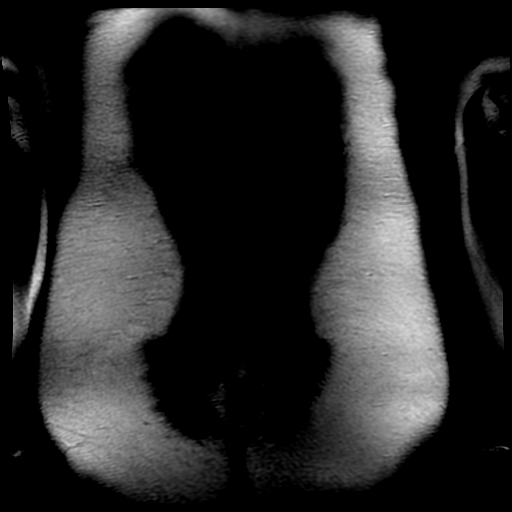

[Series 10: T2 fat-sat · coronal · 6.0mm · 0.82mm/px · 2 of 26 slices shown (2 of 2)]
[im 1/26]
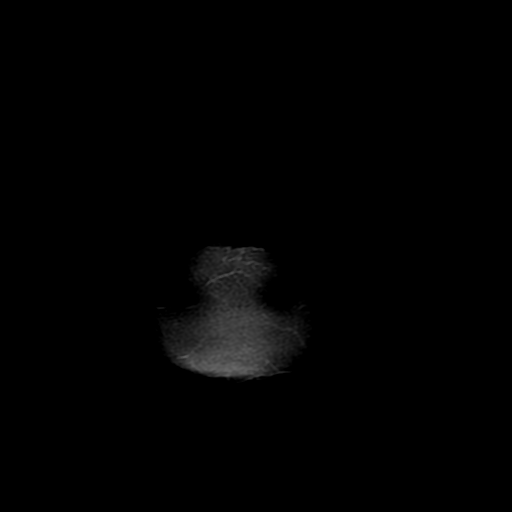
[im 26/26]
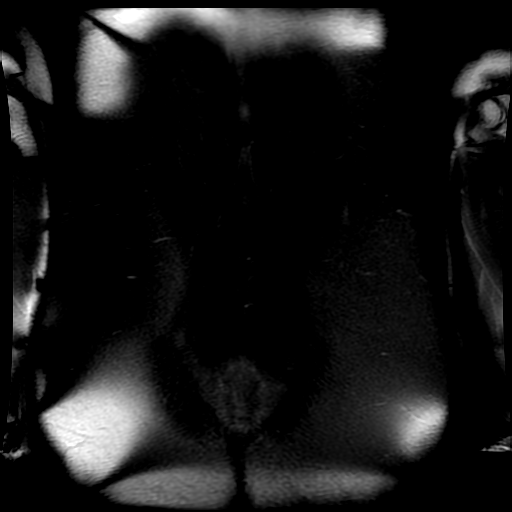

[Series 11: bSSFP · axial · 5.0mm · 0.78mm/px · z∈[-228,+180]mm · 5 of 69 slices shown]
[im 1/69]
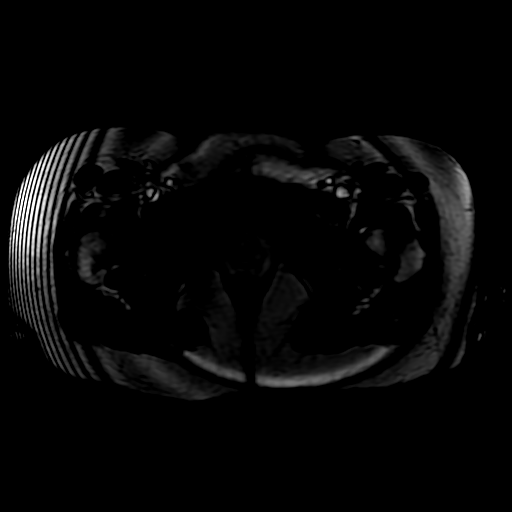
[im 18/69]
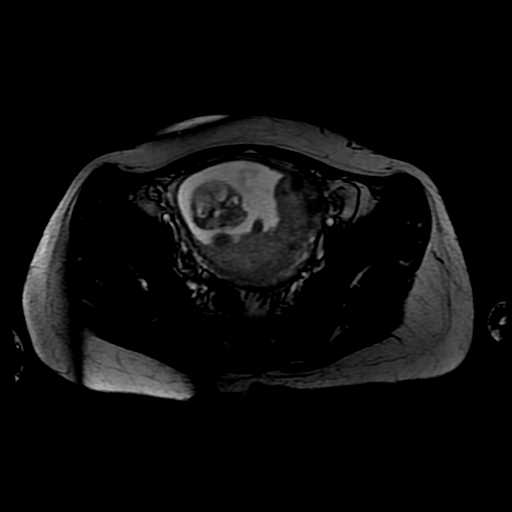
[im 35/69]
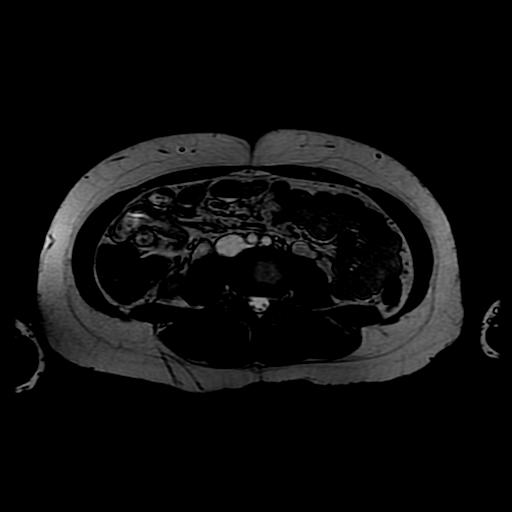
[im 52/69]
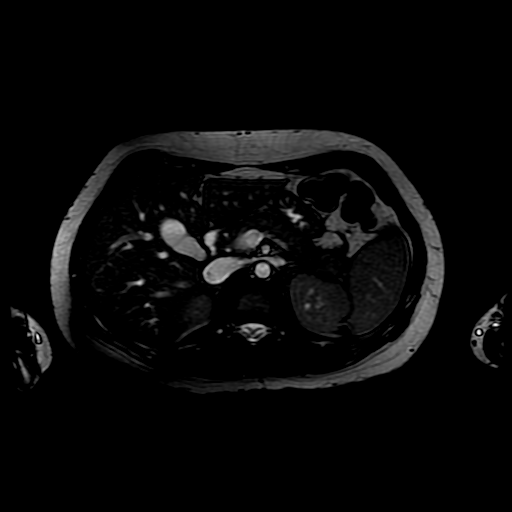
[im 69/69]
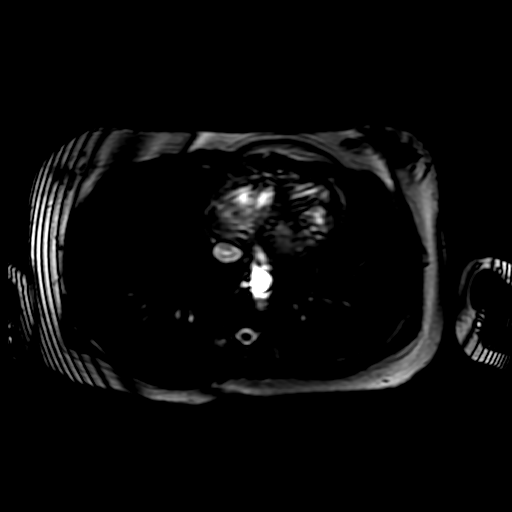

[19 of 48 positions shown; findings below may reference images not displayed]

FINDINGS: COMBINED FINDINGS FOR BOTH MR ABDOMEN AND PELVIS

Lower chest: Lung bases are clear.

Hepatobiliary: Liver is within normal limits.

Gallbladder is unremarkable. No intrahepatic or extrahepatic ductal
dilatation.

Pancreas:  Within normal limits.

Spleen:  Within normal limits.

Adrenals/Urinary Tract:  Adrenal glands are within normal limits.

Kidneys are within normal limits.  No hydronephrosis.

Bladder is underdistended but unremarkable.

Stomach/Bowel: Stomach is within normal limits.

No evidence of bowel obstruction.

Normal appendix (series 9/image 18).

Vascular/Lymphatic:  No evidence of aneurysm.

No suspicious abdominopelvic lymphadenopathy.

Reproductive: Gravid uterus. Dedicated fetal evaluation was not
performed. Left posterior placenta, free of the cervical os.

Bilateral ovaries are within normal limits.

Other:  No abdominopelvic ascites.

Musculoskeletal: No focal osseous lesions.
IMPRESSION: No evidence of appendicitis.

Gravid uterus.  Left posterior placenta, free of the cervical os.

Otherwise negative MRI abdomen/pelvis.

## 2019-06-02 ENCOUNTER — Encounter (HOSPITAL_COMMUNITY): Payer: Self-pay

## 2019-06-20 DIAGNOSIS — K0889 Other specified disorders of teeth and supporting structures: Secondary | ICD-10-CM | POA: Diagnosis not present

## 2019-06-23 DIAGNOSIS — F319 Bipolar disorder, unspecified: Secondary | ICD-10-CM | POA: Diagnosis not present

## 2019-06-23 DIAGNOSIS — Z202 Contact with and (suspected) exposure to infections with a predominantly sexual mode of transmission: Secondary | ICD-10-CM | POA: Diagnosis not present

## 2019-06-23 DIAGNOSIS — R87610 Atypical squamous cells of undetermined significance on cytologic smear of cervix (ASC-US): Secondary | ICD-10-CM | POA: Diagnosis not present

## 2019-06-23 DIAGNOSIS — Z124 Encounter for screening for malignant neoplasm of cervix: Secondary | ICD-10-CM | POA: Diagnosis not present

## 2019-06-23 DIAGNOSIS — Z113 Encounter for screening for infections with a predominantly sexual mode of transmission: Secondary | ICD-10-CM | POA: Diagnosis not present

## 2019-06-23 DIAGNOSIS — R87612 Low grade squamous intraepithelial lesion on cytologic smear of cervix (LGSIL): Secondary | ICD-10-CM | POA: Diagnosis not present

## 2019-08-28 ENCOUNTER — Ambulatory Visit: Payer: Medicaid Other | Attending: Internal Medicine

## 2019-08-28 DIAGNOSIS — Z3049 Encounter for surveillance of other contraceptives: Secondary | ICD-10-CM | POA: Diagnosis not present

## 2019-08-28 DIAGNOSIS — Z3202 Encounter for pregnancy test, result negative: Secondary | ICD-10-CM | POA: Diagnosis not present

## 2020-02-21 DIAGNOSIS — R509 Fever, unspecified: Secondary | ICD-10-CM | POA: Diagnosis not present

## 2020-02-21 DIAGNOSIS — J069 Acute upper respiratory infection, unspecified: Secondary | ICD-10-CM | POA: Diagnosis not present

## 2020-02-21 DIAGNOSIS — J029 Acute pharyngitis, unspecified: Secondary | ICD-10-CM | POA: Diagnosis not present

## 2020-02-21 DIAGNOSIS — R05 Cough: Secondary | ICD-10-CM | POA: Diagnosis not present

## 2020-09-19 DIAGNOSIS — R059 Cough, unspecified: Secondary | ICD-10-CM | POA: Diagnosis not present

## 2020-09-19 DIAGNOSIS — R0982 Postnasal drip: Secondary | ICD-10-CM | POA: Diagnosis not present

## 2020-09-19 DIAGNOSIS — R0602 Shortness of breath: Secondary | ICD-10-CM | POA: Diagnosis not present

## 2020-09-23 ENCOUNTER — Other Ambulatory Visit: Payer: Self-pay

## 2020-09-23 ENCOUNTER — Emergency Department: Payer: Medicaid Other

## 2020-09-23 ENCOUNTER — Emergency Department
Admission: EM | Admit: 2020-09-23 | Discharge: 2020-09-23 | Disposition: A | Discharge status: Home / Self Care | Payer: Medicaid Other | Attending: Emergency Medicine | Admitting: Emergency Medicine

## 2020-09-23 ENCOUNTER — Encounter: Payer: Self-pay | Admitting: Emergency Medicine

## 2020-09-23 DIAGNOSIS — J40 Bronchitis, not specified as acute or chronic: Secondary | ICD-10-CM | POA: Diagnosis not present

## 2020-09-23 DIAGNOSIS — Z20822 Contact with and (suspected) exposure to covid-19: Secondary | ICD-10-CM | POA: Diagnosis not present

## 2020-09-23 DIAGNOSIS — R0602 Shortness of breath: Secondary | ICD-10-CM | POA: Diagnosis not present

## 2020-09-23 DIAGNOSIS — R059 Cough, unspecified: Secondary | ICD-10-CM | POA: Diagnosis not present

## 2020-09-23 DIAGNOSIS — F17210 Nicotine dependence, cigarettes, uncomplicated: Secondary | ICD-10-CM | POA: Insufficient documentation

## 2020-09-23 DIAGNOSIS — R0902 Hypoxemia: Secondary | ICD-10-CM | POA: Diagnosis not present

## 2020-09-23 LAB — RESP PANEL BY RT-PCR (FLU A&B, COVID) ARPGX2
Influenza A by PCR: NEGATIVE
Influenza B by PCR: NEGATIVE
SARS Coronavirus 2 by RT PCR: NEGATIVE

## 2020-09-23 LAB — FIBRIN DERIVATIVES D-DIMER (ARMC ONLY): Fibrin derivatives D-dimer (ARMC): 356.68 ng/mL (FEU) (ref 0.00–499.00)

## 2020-09-23 LAB — CBC
HCT: 44 % (ref 36.0–46.0)
Hemoglobin: 15.4 g/dL — ABNORMAL HIGH (ref 12.0–15.0)
MCH: 31.8 pg (ref 26.0–34.0)
MCHC: 35 g/dL (ref 30.0–36.0)
MCV: 90.9 fL (ref 80.0–100.0)
Platelets: 236 10*3/uL (ref 150–400)
RBC: 4.84 MIL/uL (ref 3.87–5.11)
RDW: 11.7 % (ref 11.5–15.5)
WBC: 12.9 10*3/uL — ABNORMAL HIGH (ref 4.0–10.5)
nRBC: 0 % (ref 0.0–0.2)

## 2020-09-23 LAB — BASIC METABOLIC PANEL
Anion gap: 5 (ref 5–15)
BUN: 10 mg/dL (ref 6–20)
CO2: 27 mmol/L (ref 22–32)
Calcium: 8.5 mg/dL — ABNORMAL LOW (ref 8.9–10.3)
Chloride: 107 mmol/L (ref 98–111)
Creatinine, Ser: 0.87 mg/dL (ref 0.44–1.00)
GFR, Estimated: >60 mL/min (ref >60)
Glucose, Bld: 124 mg/dL — ABNORMAL HIGH (ref 70–99)
Potassium: 4 mmol/L (ref 3.5–5.1)
Sodium: 139 mmol/L (ref 135–145)

## 2020-09-23 LAB — TROPONIN I (HIGH SENSITIVITY): Troponin I (High Sensitivity): 4 ng/L (ref <18)

## 2020-09-23 MED ORDER — IPRATROPIUM-ALBUTEROL 0.5-2.5 (3) MG/3ML IN SOLN
3.0000 mL | Freq: Once | RESPIRATORY_TRACT | Status: AC
  Administered 2020-09-23: 3 mL via RESPIRATORY_TRACT
  Filled 2020-09-23: qty 3

## 2020-09-23 MED ORDER — ALBUTEROL SULFATE (2.5 MG/3ML) 0.083% IN NEBU: 2.5000 mg | INHALATION_SOLUTION | Freq: Four times a day (QID) | RESPIRATORY_TRACT | 1 refills | Status: DC | PRN

## 2020-09-23 MED ORDER — AZITHROMYCIN 250 MG PO TABS: ORAL_TABLET | ORAL | 0 refills | Status: AC

## 2020-09-23 MED ORDER — METHYLPREDNISOLONE SODIUM SUCC 125 MG IJ SOLR
125.0000 mg | Freq: Once | INTRAMUSCULAR | Status: AC
  Administered 2020-09-23: 125 mg via INTRAVENOUS
  Filled 2020-09-23: qty 2

## 2020-09-23 MED ORDER — ALBUTEROL SULFATE HFA 108 (90 BASE) MCG/ACT IN AERS
2.0000 | INHALATION_SPRAY | Freq: Once | RESPIRATORY_TRACT | Status: AC
  Administered 2020-09-23: 2 via RESPIRATORY_TRACT
  Filled 2020-09-23: qty 6.7

## 2020-09-23 NOTE — ED Notes (Signed)
Primary Rn meagan notified of pt arrival to room. Brayton Caves, ed tech completing ekg and covid swab.

## 2020-09-23 NOTE — Discharge Instructions (Addendum)
We have prescribed an inhaler.  Take this every 4 to 6 hours / 24 hours.  Take the antibiotics and continue your steroids.  Return the ER if develop worsening shortness of breath.  Follow-up with your primary care doctor

## 2020-09-23 NOTE — ED Notes (Signed)
Patient ambulated around room on pulse ox, O2 sats remained in high 90's -100% on RA. Patient states that she feels better and her breathing has improved.

## 2020-09-23 NOTE — ED Triage Notes (Addendum)
Pt with wheezing noted. Pt with strong cough, shob for a week. Per ems pt with pox of 87% on ra on their arrival. Pt's oxygen increased to 4lpm in triage.

## 2020-09-23 NOTE — ED Triage Notes (Signed)
Pt arrived via EMS from home where she called out due to increased SOB over last day. Pt with cough and congestion x1 week, seen at [facility] on 2/9, given IM prednisone as well as RX for same. Pt COVID negative by testing on 2/9. Per EMS pts original O2 room air was 87% with fire department. Pt arrived at 96% on 3L.

## 2020-09-23 NOTE — ED Provider Notes (Signed)
[facility] Emergency Department Provider Note  ____________________________________________   Event Date/Time   First MD Initiated Contact with Patient 09/23/20 0109     (approximate)  I have reviewed the triage vital signs    HISTORY  Chief Complaint Shortness of Breath    HPI Carrie Peterson is a 25 y.o. female with anxiety, depression who comes in for shortness of breath.  Patient reports having a cough and congestion for 1 week.  She was seen at urgent care on 2/9 and given prednisone.  She was Covid negative at that time.  Patient states that she developed worsening shortness of breath today, severe, constant, worse with exertion, better at rest.  Patient called EMS and oxygen level was 87% and so was placed on 3 L.  Patient noted to have significant bilateral wheezing but denies any history of asthma or COPD.  Does have a 3-year history of smoking.  Denies vaping.  Denies any other risk factors for PE.  Patient has had associated cough.  Patient does report some mold in her house and her husband has had similar symptoms.  She states that she has a nebulizer machine at home but she has not needed to use it for years.          Past Medical History:  Diagnosis Date  . Anxiety   . Depression   . Fracture 02/13/2014   Back, 5 years ago.  Marland Kitchen. PTSD (post-traumatic stress disorder)   . Ulcer     Patient Active Problem List   Diagnosis Date Noted  . Spontaneous vaginal delivery 03/07/2019  . Labor and delivery, indication for care 03/06/2019  . Anxiety and depression 12/23/2016  . Cervical cancer screening 12/23/2016  . Birth control counseling 12/23/2016  . Nausea in adult 12/23/2016  . Nondisplaced fracture of base of fifth metacarpal bone, right hand, initial encounter for closed fracture 12/23/2016  . 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 Polysubstance abuse (HCC) 02/26/2014  . Depression 02/26/2014  . Emesis 02/05/2014    Past Surgical  History:  Procedure Laterality Date  . addenoidectomy    . TONSILLECTOMY AND ADENOIDECTOMY      Prior to Admission medications   Medication Sig Start Date End Date Taking? Authorizing Provider  Prenatal Vit-Fe Fumarate-FA (PRENATAL VITAMIN PLUS LOW IRON) 27-1 MG TABS Please specify directions, refills and quantity 06/03/18   Aviva SignsWilliams, Marie L, CNM    Allergies Patient has no known allergies.  Family History  Problem Relation Age of Onset  . Alcohol abuse Father   . Anxiety disorder Father   . Bipolar disorder Father   . Depression Father   . Drug abuse Father   . Diabetes Father   . Hypertension Father   . Depression Mother   . Multiple sclerosis Mother   . COPD Maternal Uncle   . Cancer Maternal Grandmother   . COPD Maternal Grandmother   . Arthritis Neg Hx   . Asthma Neg Hx   . Birth defects Neg Hx   . Early death Neg Hx   . Hearing loss Neg Hx   . Heart disease Neg Hx   . Kidney disease Neg Hx   . Hyperlipidemia Neg Hx   . Learning disabilities Neg Hx   . Mental illness Neg Hx   . Mental retardation Neg Hx   . Miscarriages / Stillbirths Neg Hx   . Stroke Neg Hx   . Vision loss Neg Hx     Social History Social  History   Tobacco Use  . Smoking status: Current Every Day Smoker    Packs/day: 0.50    Types: Cigarettes  . Smokeless tobacco: Never Used  Vaping Use  . Vaping Use: Former  . Quit date: 05/29/2017  Substance Use Topics  . Alcohol use: No  . Drug use: No      Review of Systems Constitutional: No fever/chills Eyes: No visual changes. ENT: No sore throat. Cardiovascular: Denies chest pain. Respiratory: + SOB, cough Gastrointestinal: No abdominal pain.  No nausea, no vomiting.  No diarrhea.  No constipation. Genitourinary: Negative for dysuria. Musculoskeletal: Negative for back pain. Skin: Negative for rash. Neurological: Negative for headaches, focal weakness or numbness. All other ROS  negative ____________________________________________   PHYSICAL EXAM:  VITAL SIGNS: ED Triage Vitals [09/23/20 0105]  Enc Vitals Group     BP (!) 142/90     Pulse Rate 98     Resp (!) 24     Temp      Temp src      SpO2 91 %     Weight      Height      Head Circumference      Peak Flow      Pain Score      Pain Loc      Pain Edu?      Excl. in GC?     Constitutional: Alert and oriented.  Eyes: Conjunctivae are normal. EOMI. Head: Atraumatic. Nose: No congestion/rhinnorhea. Mouth/Throat: Mucous membranes are moist.   Neck: No stridor. Trachea Midline. FROM Cardiovascular: Normal rate, regular rhythm.  Good peripheral circulation. Respiratory: no audible stridor, work of breathing, wheezing bilaterally Gastrointestinal: Soft and nontender. No distention. Musculoskeletal: No lower extremity tenderness nor edema.  No joint effusions. Neurologic:  Normal speech and language. No gross focal neurologic deficits are appreciated.  Skin:  Skin is warm, dry and intact. No rash noted. Psychiatric: Mood and affect are normal. Speech and behavior are normal. GU: Deferred   ____________________________________________   LABS (all labs ordered are listed, but only abnormal results are displayed)  Labs Reviewed  CBC - Abnormal; Notable for the following components:      Result Value   WBC 12.9 (*)    Hemoglobin 15.4 (*)    All other components within normal limits  RESP PANEL BY RT-PCR (FLU A&B, COVID) ARPGX2  BASIC METABOLIC PANEL  FIBRIN DERIVATIVES D-DIMER ([facility] ONLY)  POC URINE PREG, ED  TROPONIN I (HIGH SENSITIVITY)   ____________________________________________   ED ECG REPORT I, Concha Se, the attending physician, personally viewed and interpreted this ECG.  Normal sinus rate of 83, no ST elevation, no T wave inversions, normal intervals ____________________________________________  RADIOLOGY Vela Prose, personally viewed and evaluated these images  (plain radiographs) as part of my medical decision making, as well as reviewing the written report by the radiologist.  ED MD interpretation: No pneumonia  Official radiology report(s): DG Chest Port 1 View  Result Date: 09/23/2020 CLINICAL DATA:  Cough and shortness of breath. EXAM: PORTABLE CHEST 1 VIEW COMPARISON:  02/26/2014 FINDINGS: Mild peribronchial thickening. No focal airspace disease. Heart is normal in size with normal mediastinal contours. No pleural fluid or pneumothorax. No pulmonary edema. No acute osseous abnormalities are seen. IMPRESSION: Mild peribronchial thickening, can be seen with bronchitis or asthma. No focal airspace disease. Electronically Signed   By: Narda Rutherford M.D.   On: 09/23/2020 01:32    ____________________________________________   PROCEDURES  Procedure(s) performed (  including Critical Care):  Procedures   ____________________________________________   INITIAL IMPRESSION / ASSESSMENT AND PLAN / ED COURSE  Carrie Peterson was evaluated in Emergency Department on 09/23/2020 for the symptoms described in the history of present illness. She was evaluated in the context of the global COVID-19 pandemic, which necessitated consideration that the patient might be at risk for infection with the SARS-CoV-2 virus that causes COVID-19. Institutional protocols and algorithms that pertain to the evaluation of patients at risk for COVID-19 are in a state of rapid change based on information released by regulatory bodies including the CDC and federal and state organizations. These policies and algorithms were followed during the patient's care in the ED.     Pt presents with SOB. Suspect could be secondary to COVID. Will obtain testing and get xray to evaluate for PNA, EKG/trop to evaluate for ACS. No H/o Heart Failure. Lower suspicion for PE at this time given lower risk but will get D-dimer in case work-up is otherwise negative may need CT imaging if D-dimer is  positive..  Will get labs to evaluate for dehydration and electrolyte abnormalities.  Will continue to closely monitor patient during workup and keep on cardiac monitor. Will continue oxygen support as needed.   Patient given 3 duo nebs and steroids due to the wheezing and her history of smoking.  Patient sounds much better after DuoNeb's.  We will continue to monitor.  Work-up is reassuring.  No signs of PE with negative D-dimer and troponin was negative for ACS.  Patient ambulated on the room with oxygen levels 100%.  She is now 96 -100% on room air sitting comfortably in bed.  Repeat lung evaluation she continues to have no wheezing.  She is been in the ER for greater than 3 hours without recurrent symptoms since the duo nebs were administered.  At this point she would like to be discharged home.  We will give her albuterol inhaler to go home with as well as prescription for albuterol nebulizers since she has a nebulizer machine at home.  She is already on steroids.  We will start her on some azithromycin.  Recommended that she cut down her smoking.  Patient understands that if her symptoms are returning or she develops worsening shortness of breath she should return the ER immediately        ____________________________________________   FINAL CLINICAL IMPRESSION(S) / ED DIAGNOSES   Final diagnoses:  Bronchitis      MEDICATIONS GIVEN DURING THIS VISIT:  Medications  albuterol (VENTOLIN HFA) 108 (90 Base) MCG/ACT inhaler 2 puff (has no administration in time range)  ipratropium-albuterol (DUONEB) 0.5-2.5 (3) MG/3ML nebulizer solution 3 mL (3 mLs Nebulization Given 09/23/20 0137)  ipratropium-albuterol (DUONEB) 0.5-2.5 (3) MG/3ML nebulizer solution 3 mL (3 mLs Nebulization Given 09/23/20 0137)  ipratropium-albuterol (DUONEB) 0.5-2.5 (3) MG/3ML nebulizer solution 3 mL (3 mLs Nebulization Given 09/23/20 0137)  methylPREDNISolone sodium succinate (SOLU-MEDROL) 125 mg/2 mL injection 125  mg (125 mg Intravenous Given 09/23/20 4680)     ED Discharge Orders         Ordered    albuterol (PROVENTIL) (2.5 MG/3ML) 0.083% nebulizer solution  Every 6 hours PRN        09/23/20 0351    azithromycin (ZITHROMAX Z-PAK) 250 MG tablet        09/23/20 3212           Note:  This document was prepared using Dragon voice recognition software and may include unintentional dictation errors.  Concha Se, MD 09/23/20 [phone]

## 2022-01-21 ENCOUNTER — Emergency Department (HOSPITAL_COMMUNITY): Payer: Medicaid Other

## 2022-01-21 ENCOUNTER — Encounter (HOSPITAL_COMMUNITY): Payer: Self-pay | Admitting: Emergency Medicine

## 2022-01-21 ENCOUNTER — Emergency Department (HOSPITAL_COMMUNITY)
Admission: EM | Admit: 2022-01-21 | Discharge: 2022-01-21 | Discharge status: Against Medical Advice | Payer: Medicaid Other | Attending: Emergency Medicine | Admitting: Emergency Medicine

## 2022-01-21 ENCOUNTER — Other Ambulatory Visit: Payer: Self-pay

## 2022-01-21 DIAGNOSIS — D72829 Elevated white blood cell count, unspecified: Secondary | ICD-10-CM | POA: Insufficient documentation

## 2022-01-21 DIAGNOSIS — R062 Wheezing: Secondary | ICD-10-CM | POA: Diagnosis not present

## 2022-01-21 DIAGNOSIS — R109 Unspecified abdominal pain: Secondary | ICD-10-CM

## 2022-01-21 DIAGNOSIS — R0602 Shortness of breath: Secondary | ICD-10-CM | POA: Insufficient documentation

## 2022-01-21 DIAGNOSIS — R1013 Epigastric pain: Secondary | ICD-10-CM | POA: Insufficient documentation

## 2022-01-21 DIAGNOSIS — R1110 Vomiting, unspecified: Secondary | ICD-10-CM | POA: Insufficient documentation

## 2022-01-21 LAB — COMPREHENSIVE METABOLIC PANEL
ALT: 75 U/L — ABNORMAL HIGH (ref 0–44)
AST: 45 U/L — ABNORMAL HIGH (ref 15–41)
Albumin: 4.3 g/dL (ref 3.5–5.0)
Alkaline Phosphatase: 205 U/L — ABNORMAL HIGH (ref 38–126)
Anion gap: 11 (ref 5–15)
BUN: 12 mg/dL (ref 6–20)
CO2: 25 mmol/L (ref 22–32)
Calcium: 9.6 mg/dL (ref 8.9–10.3)
Chloride: 104 mmol/L (ref 98–111)
Creatinine, Ser: 0.78 mg/dL (ref 0.44–1.00)
GFR, Estimated: >60 mL/min (ref >60)
Glucose, Bld: 119 mg/dL — ABNORMAL HIGH (ref 70–99)
Potassium: 3.7 mmol/L (ref 3.5–5.1)
Sodium: 140 mmol/L (ref 135–145)
Total Bilirubin: 0.9 mg/dL (ref 0.3–1.2)
Total Protein: 7.7 g/dL (ref 6.5–8.1)

## 2022-01-21 LAB — CBC
HCT: 49.6 % — ABNORMAL HIGH (ref 36.0–46.0)
Hemoglobin: 17.2 g/dL — ABNORMAL HIGH (ref 12.0–15.0)
MCH: 32 pg (ref 26.0–34.0)
MCHC: 34.7 g/dL (ref 30.0–36.0)
MCV: 92.4 fL (ref 80.0–100.0)
Platelets: 333 10*3/uL (ref 150–400)
RBC: 5.37 MIL/uL — ABNORMAL HIGH (ref 3.87–5.11)
RDW: 12.6 % (ref 11.5–15.5)
WBC: 15.9 10*3/uL — ABNORMAL HIGH (ref 4.0–10.5)
nRBC: 0 % (ref 0.0–0.2)

## 2022-01-21 LAB — LIPASE, BLOOD: Lipase: 26 U/L (ref 11–51)

## 2022-01-21 LAB — I-STAT BETA HCG BLOOD, ED (MC, WL, AP ONLY): I-stat hCG, quantitative: <5 m[IU]/mL (ref <5)

## 2022-01-21 MED ORDER — SODIUM CHLORIDE 0.9 % IV BOLUS
1000.0000 mL | Freq: Once | INTRAVENOUS | Status: AC
  Administered 2022-01-21: 1000 mL via INTRAVENOUS

## 2022-01-21 MED ORDER — ALBUTEROL SULFATE (2.5 MG/3ML) 0.083% IN NEBU
2.5000 mg | INHALATION_SOLUTION | Freq: Once | RESPIRATORY_TRACT | Status: AC
  Administered 2022-01-21: 2.5 mg via RESPIRATORY_TRACT
  Filled 2022-01-21: qty 3

## 2022-01-21 MED ORDER — ALBUTEROL SULFATE (2.5 MG/3ML) 0.083% IN NEBU
2.5000 mg | INHALATION_SOLUTION | Freq: Once | RESPIRATORY_TRACT | Status: AC
Start: 2022-01-21 — End: 2022-01-21
  Administered 2022-01-21: 2.5 mg via RESPIRATORY_TRACT
  Filled 2022-01-21: qty 3

## 2022-01-21 MED ORDER — MORPHINE SULFATE (PF) 4 MG/ML IV SOLN
4.0000 mg | Freq: Once | INTRAVENOUS | Status: AC
  Administered 2022-01-21: 4 mg via INTRAVENOUS
  Filled 2022-01-21: qty 1

## 2022-01-21 MED ORDER — IOHEXOL 300 MG/ML  SOLN
100.0000 mL | Freq: Once | INTRAMUSCULAR | Status: AC | PRN
Start: 2022-01-21 — End: 2022-01-21
  Administered 2022-01-21: 100 mL via INTRAVENOUS

## 2022-01-21 MED ORDER — ALBUTEROL SULFATE HFA 108 (90 BASE) MCG/ACT IN AERS
2.0000 | INHALATION_SPRAY | RESPIRATORY_TRACT | Status: DC | PRN
Start: 2022-01-21 — End: 2022-01-22

## 2022-01-21 MED ORDER — ONDANSETRON HCL 4 MG/2ML IJ SOLN
4.0000 mg | Freq: Once | INTRAMUSCULAR | Status: AC
  Administered 2022-01-21: 4 mg via INTRAVENOUS
  Filled 2022-01-21: qty 2

## 2022-01-21 MED ORDER — ONDANSETRON 8 MG PO TBDP
8.0000 mg | ORAL_TABLET | Freq: Once | ORAL | Status: AC
Start: 2022-01-21 — End: 2022-01-21
  Administered 2022-01-21: 8 mg via ORAL
  Filled 2022-01-21: qty 1

## 2022-01-21 NOTE — ED Notes (Signed)
Pt IV found by staff on the bedside table. Pt not in room. Looked in department for patient but unable to find. [name], PA made aware that pt eloped and Iv at bedside.

## 2022-01-21 NOTE — ED Provider Notes (Signed)
[facility]-EMERGENCY DEPT Provider Note   CSN: [id number] Arrival date & time: 01/21/22  1233     History  Chief Complaint  Patient presents with   Abdominal Pain   Emesis   Shortness of Breath    Carrie Peterson is a 26 y.o. female.   Abdominal Pain Associated symptoms: shortness of breath and vomiting   Associated symptoms: no chest pain, no chills, no cough, no dysuria, no fever, no hematuria and no sore throat   Emesis Associated symptoms: abdominal pain   Associated symptoms: no arthralgias, no chills, no cough, no fever and no sore throat   Shortness of Breath Associated symptoms: abdominal pain, vomiting and wheezing   Associated symptoms: no chest pain, no cough, no ear pain, no fever, no rash and no sore throat     26 year old female presents emergency department with complaints of abdominal pain.  Patient states that she woke up yesterday morning and felt "sick to her stomach" and began vomiting.  Pain is located in her epigastric region.  She states she has not been able to eat solids and immediately throws up upon consumption of liquids. Notices blood-tinged vomit.  Associated symptoms include shortness of breath as well as chest pain.  Denies history of lung disease but confirms smoking marijuana every other day.  Chest pain is noticed when she takes a deep breath and is located midsternal.  Denies known sick contact, fever, chills, night sweats, urinary/vaginal symptoms, change in bowel habits.  Denies alcohol use prior to symptom onset.  Home Medications Prior to Admission medications   Medication Sig Start Date End Date Taking? Authorizing Provider  albuterol (PROVENTIL) (2.5 MG/3ML) 0.083% nebulizer solution Take 3 mLs (2.5 mg total) by nebulization every 6 (six) hours as needed for wheezing or shortness of breath. 09/23/20   Concha Se, MD  Prenatal Vit-Fe Fumarate-FA (PRENATAL VITAMIN PLUS LOW IRON) 27-1 MG TABS Please specify directions,  refills and quantity 06/03/18   Aviva Signs, CNM      Allergies    Patient has no known allergies.    Review of Systems   Review of Systems  Constitutional:  Negative for chills and fever.  HENT:  Negative for ear pain and sore throat.   Eyes:  Negative for pain and visual disturbance.  Respiratory:  Positive for shortness of breath and wheezing. Negative for cough.   Cardiovascular:  Negative for chest pain and palpitations.  Gastrointestinal:  Positive for abdominal pain and vomiting.  Genitourinary:  Negative for dysuria and hematuria.  Musculoskeletal:  Negative for arthralgias and back pain.  Skin:  Negative for color change and rash.  Neurological:  Negative for seizures and syncope.  All other systems reviewed and are negative.   Physical Exam Updated Vital Signs BP 113/71   Pulse 89   Temp 98.1 F (36.7 C) (Oral)   Resp 16   SpO2 94%  Physical Exam Vitals and nursing note reviewed.  Constitutional:      General: She is not in acute distress.    Appearance: She is well-developed. She is not ill-appearing, toxic-appearing or diaphoretic.  HENT:     Head: Normocephalic and atraumatic.     Mouth/Throat:     Mouth: Mucous membranes are moist.  Eyes:     Extraocular Movements: Extraocular movements intact.     Conjunctiva/sclera: Conjunctivae normal.  Cardiovascular:     Rate and Rhythm: Normal rate and regular rhythm.     Heart sounds: No murmur heard.  Pulmonary:     Effort: Pulmonary effort is normal. No respiratory distress.     Breath sounds: Normal breath sounds.  Abdominal:     General: Abdomen is flat.     Palpations: Abdomen is soft.     Tenderness: There is abdominal tenderness in the epigastric area. There is no guarding or rebound. Positive signs include Murphy's sign. Negative signs include McBurney's sign.  Musculoskeletal:        General: No swelling.     Cervical back: Neck supple.  Skin:    General: Skin is warm and dry.     Capillary  Refill: Capillary refill takes less than 2 seconds.  Neurological:     General: No focal deficit present.     Mental Status: She is alert and oriented to person, place, and time.  Psychiatric:        Mood and Affect: Mood normal.        Behavior: Behavior normal.     ED Results / Procedures / Treatments   Labs (all labs ordered are listed, but only abnormal results are displayed) Labs Reviewed  COMPREHENSIVE METABOLIC PANEL - Abnormal; Notable for the following components:      Result Value   Glucose, Bld 119 (*)    AST 45 (*)    ALT 75 (*)    Alkaline Phosphatase 205 (*)    All other components within normal limits  CBC - Abnormal; Notable for the following components:   WBC 15.9 (*)    RBC 5.37 (*)    Hemoglobin 17.2 (*)    HCT 49.6 (*)    All other components within normal limits  LIPASE, BLOOD  URINALYSIS, ROUTINE W REFLEX MICROSCOPIC  I-STAT BETA HCG BLOOD, ED ([facility], [facility], [facility] ONLY)    EKG None  Radiology CT ABDOMEN PELVIS W CONTRAST  Result Date: 01/21/2022 CLINICAL DATA:  Pain right lower quadrant of abdomen, nausea, vomiting EXAM: CT ABDOMEN AND PELVIS WITH CONTRAST TECHNIQUE: Multidetector CT imaging of the abdomen and pelvis was performed using the standard protocol following bolus administration of intravenous contrast. RADIATION DOSE REDUCTION: This exam was performed according to the departmental dose-optimization program which includes automated exposure control, adjustment of the mA and/or kV according to patient size and/or use of iterative reconstruction technique. CONTRAST:  OMNIPAQUE IOHEXOL 300 MG/ML  SOLN COMPARISON:  None Available. FINDINGS: Lower chest: Unremarkable. Hepatobiliary: There is subtle low-attenuation in the liver close to falciform ligament possibly focal fatty infiltration or related to aberrant venous drainage. There is no dilation of bile ducts. Gallbladder is unremarkable. Pancreas: No focal abnormality is seen. Spleen: Spleen measures  13.1 cm in maximum diameter. Adrenals/Urinary Tract: Adrenals are unremarkable. There is no hydronephrosis. There are no renal or ureteral stones. Urinary bladder is not distended. Stomach/Bowel: Stomach is unremarkable. Small bowel loops are not dilated. Appendix is not dilated. There is no significant wall thickening in colon. There is no pericolic stranding. Vascular/Lymphatic: Unremarkable. Reproductive: Unremarkable. Other: There is no ascites or pneumoperitoneum. Musculoskeletal: No acute findings are seen. IMPRESSION: There is no evidence of intestinal obstruction or pneumoperitoneum. Appendix is not dilated. There is no hydronephrosis. Electronically Signed   By: Ernie Avena M.D.   On: 01/21/2022 16:54   DG Chest 2 View  Result Date: 01/21/2022 CLINICAL DATA:  Shortness of breath EXAM: CHEST - 2 VIEW COMPARISON:  09/23/2020 FINDINGS: The heart size and mediastinal contours are within normal limits. Both lungs are clear. Mild pectus excavatum deformity is noted. IMPRESSION: No  active cardiopulmonary disease. Electronically Signed   By: Ernie Avena M.D.   On: 01/21/2022 13:24    Procedures Procedures    Medications Ordered in ED Medications  albuterol (VENTOLIN HFA) 108 (90 Base) MCG/ACT inhaler 2 puff (has no administration in time range)  ondansetron (ZOFRAN-ODT) disintegrating tablet 8 mg (8 mg Oral Given 01/21/22 1302)  sodium chloride 0.9 % bolus 1,000 mL (0 mLs Intravenous Stopped 01/21/22 1739)  morphine (PF) 4 MG/ML injection 4 mg (4 mg Intravenous Given 01/21/22 1427)  albuterol (PROVENTIL) (2.5 MG/3ML) 0.083% nebulizer solution 2.5 mg (2.5 mg Nebulization Given 01/21/22 1427)  ondansetron (ZOFRAN) injection 4 mg (4 mg Intravenous Given 01/21/22 1655)  albuterol (PROVENTIL) (2.5 MG/3ML) 0.083% nebulizer solution 2.5 mg (2.5 mg Nebulization Given 01/21/22 1655)  iohexol (OMNIPAQUE) 300 MG/ML solution 100 mL (100 mLs Intravenous Contrast Given 01/21/22 1639)    ED Course/  Medical Decision Making/ A&P                           Medical Decision Making Amount and/or Complexity of Data Reviewed Labs: ordered. Radiology: ordered.  Risk Prescription drug management.   This patient presents to the ED for concern of abdominal pain, this involves an extensive number of treatment options, and is a complaint that carries with it a high risk of complications and morbidity.  The differential diagnosis includes The causes of generalized abdominal pain include but are not limited to AAA, mesenteric ischemia, appendicitis, diverticulitis, DKA, gastritis, gastroenteritis, AMI, nephrolithiasis, pancreatitis, peritonitis, adrenal insufficiency,lead poisoning, iron toxicity, intestinal ischemia, constipation, UTI,SBO/LBO, splenic rupture, biliary disease, IBD, IBS, PUD, or hepatitis. Ectopic pregnancy, ovarian torsion, PID.  Co morbidities that complicate the patient evaluation  Anxiety, depression, PTSD   Lab Tests:  I Ordered, and personally interpreted labs.  The pertinent results include: WBC 15.9, Hgb 17.2   Imaging Studies ordered:  I ordered imaging studies including chest x-ray, CT abdomen pelvis I independently visualized and interpreted imaging which showed  Chest x-ray: No acute cardiopulmonary process CT abdomen pelvis: No acute abnormality I agree with the radiologist interpretation   Cardiac Monitoring: / EKG:  The patient was maintained on a cardiac monitor.  I personally viewed and interpreted the cardiac monitored which showed an underlying rhythm of: Sinus rhythm   Consultations Obtained:  N/a   Problem List / ED Course / Critical interventions / Medication management  Abdominal pain I ordered medication including  Morphine for pain Zofran for nausea Albuterol for wheeze   Reevaluation of the patient after these medicines showed that the patient improved I have reviewed the patients home medicines and have made adjustments as  needed   Social Determinants of Health:  Chronic cigarette and marijuana use.   Test / Admission - Considered:  Abdominal pain Vitals signs significant for intermittent tachycardia that responded well to fluids as well as albuterol pain control. Otherwise within normal range and stable throughout visit. Laboratory/imaging studies significant for: Elevated white count of 15.9.  In comparison, patient has baseline elevated white count.  Imaging studies were overall negative. Reevaluation of the patient showed resolution of abdominal symptoms.  Patient no longer experiencing pain in the right lower quadrant at all.  When discussing about p.o. challenge as well as at home symptomatic control, patient eloped.  Doubt appendicitis, ovarian torsion, tubo-ovarian abscess, Crohn's given negative work-up.  Patient expected to be discharged but eloped before the conversation was had.        Final  Clinical Impression(s) / ED Diagnoses Final diagnoses:  Abdominal pain, unspecified abdominal location    Rx / DC Orders ED Discharge Orders     None         Peter GarterRobbins, Dev Dhondt A, [place] 01/21/22 Brooke Pace1957    Ernie AvenaLawsing, James, MD 01/22/22 0021

## 2022-01-21 NOTE — ED Provider Triage Note (Addendum)
Emergency Medicine Provider Triage Evaluation Note  Carrie Peterson , a 26 y.o. female  was evaluated in triage.  Pt complains of abdominal pain.  Patient states that she woke up yesterday morning and felt "sick to her stomach" and began vomiting.  Pain is located in her epigastric region.  She states she has not been able to eat solids and immediately throws up upon consumption of liquids.  Notices blood-tinged vomit.  Associated symptoms include shortness of breath as well as chest pain.  Denies history of lung disease but confirms smoking marijuana every other day.  Chest pain is noticed when she takes a deep breath and is located midsternal.  Denies known sick contact, fever, chills, night sweats, urinary/vaginal symptoms, change in bowel habits.  Denies alcohol use prior to symptom onset.  Review of Systems  Positive: See above Negative:   Physical Exam  BP 117/77 (BP Location: Left Arm)   Pulse 99   Temp 98.1 F (36.7 C) (Oral)   Resp 14   SpO2 95%  Gen:   Awake, no distress   Resp:  Normal effort.  Wheeze heard throughout lung fields MSK:   Moves extremities without difficulty  Other:  Epigastric tenderness.  Medical Decision Making  Medically screening exam initiated at 12:49 PM.  Appropriate orders placed.  Carrie Peterson was informed that the remainder of the evaluation will be completed by another provider, this initial triage assessment does not replace that evaluation, and the importance of remaining in the ED until their evaluation is complete.     [name], [place] 01/21/22 1257    [name], [place] 01/21/22 1258

## 2022-01-21 NOTE — ED Triage Notes (Signed)
Pt reports SHOB, N/V, abd pain, and numbness in her hands x a few days.

## 2022-02-20 NOTE — Congregational Nurse Program (Signed)
  Dept: [phone]   Congregational Nurse Program Note  Date of Encounter: 02/20/2022  Past Medical History: Past Medical History:  Diagnosis Date   Anxiety    Depression    Fracture 02/13/2014   Back, 5 years ago.   PTSD (post-traumatic stress disorder)    Ulcer     Encounter Details:  CNP Questionnaire - 01/23/22 1500       Questionnaire   Do you give verbal consent to treat you today? Yes    Location Patient Served  [facility]    Visit Setting Church or Organization    Patient Status Unknown    Insurance Unknown    Insurance Referral N/A    Medication N/A    Medical Provider No    Screening Referrals N/A    Medical Referral Other   [facility]   Medical Appointment Made Other    Food N/A    Transportation N/A    Housing/Utilities No permanent housing    Interpersonal Safety N/A    Intervention Case Management    ED Visit Averted N/A    Life-Saving Intervention Made N/A             Patient states the only thing she would like today is a referral where she can go get help for Bipolar and PTSD. [facility] to [facility] [place].  De Hollingshead, Chief Financial Officer Cell [phone]

## 2022-07-22 LAB — HM PAP SMEAR

## 2022-07-24 ENCOUNTER — Other Ambulatory Visit: Payer: Self-pay | Admitting: General Practice

## 2022-07-24 DIAGNOSIS — N6310 Unspecified lump in the right breast, unspecified quadrant: Secondary | ICD-10-CM

## 2022-07-28 ENCOUNTER — Encounter: Payer: Self-pay | Admitting: General Practice

## 2022-07-29 ENCOUNTER — Inpatient Hospital Stay: Admission: RE | Admit: 2022-07-29 | Payer: Medicaid Other | Source: Ambulatory Visit

## 2022-09-21 ENCOUNTER — Other Ambulatory Visit: Payer: Self-pay

## 2022-09-21 ENCOUNTER — Ambulatory Visit: Payer: Medicaid Other | Admitting: Obstetrics and Gynecology

## 2022-12-15 ENCOUNTER — Encounter (HOSPITAL_COMMUNITY): Payer: Self-pay

## 2022-12-15 ENCOUNTER — Other Ambulatory Visit: Payer: Self-pay

## 2022-12-15 ENCOUNTER — Emergency Department (HOSPITAL_COMMUNITY)
Admission: EM | Admit: 2022-12-15 | Discharge: 2022-12-15 | Disposition: A | Discharge status: Home / Self Care | Payer: Medicaid Other | Attending: Student in an Organized Health Care Education/Training Program | Admitting: Student in an Organized Health Care Education/Training Program

## 2022-12-15 DIAGNOSIS — E860 Dehydration: Secondary | ICD-10-CM | POA: Diagnosis not present

## 2022-12-15 DIAGNOSIS — R197 Diarrhea, unspecified: Secondary | ICD-10-CM | POA: Insufficient documentation

## 2022-12-15 DIAGNOSIS — R112 Nausea with vomiting, unspecified: Secondary | ICD-10-CM

## 2022-12-15 DIAGNOSIS — R000 Tachycardia, unspecified: Secondary | ICD-10-CM | POA: Diagnosis not present

## 2022-12-15 DIAGNOSIS — E876 Hypokalemia: Secondary | ICD-10-CM | POA: Diagnosis not present

## 2022-12-15 LAB — URINALYSIS, ROUTINE W REFLEX MICROSCOPIC
Bilirubin Urine: NEGATIVE
Glucose, UA: NEGATIVE mg/dL
Hgb urine dipstick: NEGATIVE
Ketones, ur: NEGATIVE mg/dL
Leukocytes,Ua: NEGATIVE
Nitrite: NEGATIVE
Protein, ur: NEGATIVE mg/dL
Specific Gravity, Urine: 1.004 — ABNORMAL LOW (ref 1.005–1.030)
pH: 6 (ref 5.0–8.0)

## 2022-12-15 LAB — COMPREHENSIVE METABOLIC PANEL
ALT: 22 U/L (ref 0–44)
AST: 21 U/L (ref 15–41)
Albumin: 4 g/dL (ref 3.5–5.0)
Alkaline Phosphatase: 126 U/L (ref 38–126)
Anion gap: 12 (ref 5–15)
BUN: 11 mg/dL (ref 6–20)
CO2: 20 mmol/L — ABNORMAL LOW (ref 22–32)
Calcium: 9.3 mg/dL (ref 8.9–10.3)
Chloride: 108 mmol/L (ref 98–111)
Creatinine, Ser: 1.12 mg/dL — ABNORMAL HIGH (ref 0.44–1.00)
GFR, Estimated: >60 mL/min (ref >60)
Glucose, Bld: 102 mg/dL — ABNORMAL HIGH (ref 70–99)
Potassium: 3.2 mmol/L — ABNORMAL LOW (ref 3.5–5.1)
Sodium: 140 mmol/L (ref 135–145)
Total Bilirubin: 0.8 mg/dL (ref 0.3–1.2)
Total Protein: 7 g/dL (ref 6.5–8.1)

## 2022-12-15 LAB — I-STAT BETA HCG BLOOD, ED (MC, WL, AP ONLY): I-stat hCG, quantitative: <5 m[IU]/mL (ref <5)

## 2022-12-15 LAB — CBC
HCT: 42.7 % (ref 36.0–46.0)
Hemoglobin: 14.9 g/dL (ref 12.0–15.0)
MCH: 32.1 pg (ref 26.0–34.0)
MCHC: 34.9 g/dL (ref 30.0–36.0)
MCV: 92 fL (ref 80.0–100.0)
Platelets: 381 10*3/uL (ref 150–400)
RBC: 4.64 MIL/uL (ref 3.87–5.11)
RDW: 11.9 % (ref 11.5–15.5)
WBC: 11.2 10*3/uL — ABNORMAL HIGH (ref 4.0–10.5)
nRBC: 0 % (ref 0.0–0.2)

## 2022-12-15 LAB — CBG MONITORING, ED: Glucose-Capillary: 94 mg/dL (ref 70–99)

## 2022-12-15 LAB — LIPASE, BLOOD: Lipase: 51 U/L (ref 11–51)

## 2022-12-15 MED ORDER — ALUM & MAG HYDROXIDE-SIMETH 200-200-20 MG/5ML PO SUSP
30.0000 mL | Freq: Once | ORAL | Status: AC
  Administered 2022-12-15: 30 mL via ORAL
  Filled 2022-12-15: qty 30

## 2022-12-15 MED ORDER — ONDANSETRON HCL 4 MG PO TABS: 4.0000 mg | ORAL_TABLET | Freq: Three times a day (TID) | ORAL | 0 refills | Status: DC | PRN

## 2022-12-15 MED ORDER — POTASSIUM CHLORIDE 20 MEQ PO PACK
40.0000 meq | PACK | Freq: Once | ORAL | Status: AC
Start: 2022-12-15 — End: 2022-12-15
  Administered 2022-12-15: 40 meq via ORAL
  Filled 2022-12-15: qty 2

## 2022-12-15 MED ORDER — SODIUM CHLORIDE 0.9 % IV BOLUS
1000.0000 mL | Freq: Once | INTRAVENOUS | Status: AC
  Administered 2022-12-15: 1000 mL via INTRAVENOUS

## 2022-12-15 MED ORDER — POTASSIUM CHLORIDE 10 MEQ/100ML IV SOLN: 10.0000 meq | INTRAVENOUS | Status: DC

## 2022-12-15 MED ORDER — ONDANSETRON HCL 4 MG/2ML IJ SOLN
4.0000 mg | Freq: Once | INTRAMUSCULAR | Status: AC
  Administered 2022-12-15: 4 mg via INTRAVENOUS
  Filled 2022-12-15: qty 2

## 2022-12-15 NOTE — ED Triage Notes (Signed)
Pt c/o N/V/D, SOBx3d. Pt states vomited six times in last 24 hrs. Pt states had 7-8 bouts of diarrhea in last 24 hrs. Pt does not appear to be in resp distress at this time. Pt is eupneic.

## 2022-12-15 NOTE — ED Provider Notes (Addendum)
Carrie Peterson   CSN: [id number] Arrival date & time: 12/15/22  4098     History  Chief Complaint  Patient presents with   Emesis    Carrie Peterson is a 27 y.o. female with PMH significant for depression, anxiety, and polysubstance abuse in the past presenting with N/V/D in the last 3-4 days.  Patient reports she has not been able to keep anything down as she continues to have emesis with any attempts to have an oral intake including food and water.  Having about 6 nonbloody emesis a day and 6-7 nonbloody watery diarrhea.  Denies any recent travel or unusual meal and no recent sick contacts.  No one else in the household have similar symptoms.  She lives with with mom, fiance and kids.    Emesis Associated symptoms: diarrhea   Associated symptoms: no abdominal pain, no chills, no cough, no fever and no headaches        Home Medications Prior to Admission medications   Medication Sig Start Date End Date Taking? Authorizing Provider  ondansetron (ZOFRAN) 4 MG tablet Take 1 tablet (4 mg total) by mouth every 8 (eight) hours as needed for nausea or vomiting. 12/15/22  Yes Lowther, Amy, DO  albuterol (PROVENTIL) (2.5 MG/3ML) 0.083% nebulizer solution Take 3 mLs (2.5 mg total) by nebulization every 6 (six) hours as needed for wheezing or shortness of breath. 09/23/20   Concha Se, MD  Prenatal Vit-Fe Fumarate-FA (PRENATAL VITAMIN PLUS LOW IRON) 27-1 MG TABS Please specify directions, refills and quantity 06/03/18   Aviva Signs, CNM      Allergies    Patient has no known allergies.    Review of Systems   Review of Systems  Constitutional:  Positive for fatigue. Negative for chills and fever.  Respiratory:  Negative for cough, choking, chest tightness and shortness of breath.   Cardiovascular:  Negative for chest pain and leg swelling.  Gastrointestinal:  Positive for diarrhea, nausea and vomiting. Negative for abdominal  distention, abdominal pain, blood in stool and constipation.  Genitourinary:  Negative for dysuria, hematuria and menstrual problem.  Musculoskeletal:  Negative for back pain, joint swelling and neck pain.  Neurological:  Negative for dizziness, weakness, light-headedness and headaches.    Physical Exam Updated Vital Signs BP 117/81   Pulse 64   Temp 98.8 F (37.1 C) (Oral)   Resp 16   Ht 5\' 6"  (1.676 m)   Wt 82.1 kg   SpO2 98%   BMI 29.21 kg/m  Physical Exam Constitutional:      Appearance: Normal appearance. She is normal weight. She is not ill-appearing.  HENT:     Mouth/Throat:     Mouth: Mucous membranes are dry.     Pharynx: Oropharynx is clear.  Eyes:     Extraocular Movements: Extraocular movements intact.     Conjunctiva/sclera: Conjunctivae normal.     Pupils: Pupils are equal, round, and reactive to light.  Cardiovascular:     Rate and Rhythm: Regular rhythm. Tachycardia present.     Heart sounds: Normal heart sounds.  Pulmonary:     Effort: Pulmonary effort is normal.     Breath sounds: Normal breath sounds. No wheezing or rhonchi.  Abdominal:     General: Abdomen is flat. There is no distension.     Palpations: Abdomen is soft.     Tenderness: There is no abdominal tenderness.     Comments: Increased bowel sound.  Musculoskeletal:        General: Normal range of motion.  Skin:    General: Skin is warm and dry.     Capillary Refill: Capillary refill takes 2 to 3 seconds.  Neurological:     General: No focal deficit present.     Mental Status: She is alert and oriented to person, place, and time. Mental status is at baseline.  Psychiatric:        Mood and Affect: Mood normal.        Thought Content: Thought content normal.     ED Results / Procedures / Treatments   Labs (all labs ordered are listed, but only abnormal results are displayed) Labs Reviewed  COMPREHENSIVE METABOLIC PANEL - Abnormal; Notable for the following components:      Result  Value   Potassium 3.2 (*)    CO2 20 (*)    Glucose, Bld 102 (*)    Creatinine, Ser 1.12 (*)    All other components within normal limits  CBC - Abnormal; Notable for the following components:   WBC 11.2 (*)    All other components within normal limits  URINALYSIS, ROUTINE W REFLEX MICROSCOPIC - Abnormal; Notable for the following components:   Specific Gravity, Urine 1.004 (*)    All other components within normal limits  LIPASE, BLOOD  I-STAT BETA HCG BLOOD, ED ([facility], [facility], [facility] ONLY)  CBG MONITORING, ED    EKG None  Radiology No results found.  Procedures Procedures    Medications Ordered in ED Medications  sodium chloride 0.9 % bolus 1,000 mL (0 mLs Intravenous Stopped 12/15/22 0922)  ondansetron (ZOFRAN) injection 4 mg (4 mg Intravenous Given 12/15/22 0838)  alum & mag hydroxide-simeth (MAALOX/MYLANTA) 200-200-20 MG/5ML suspension 30 mL (30 mLs Oral Given 12/15/22 0838)  potassium chloride (KLOR-CON) packet 40 mEq (40 mEq Oral Given 12/15/22 0913)    ED Course/ Medical Decision Making/ A&P    Medical Decision Making Patient with nausea, vomiting and diarrhea for the past 3-4 days.  Lab is generally unremarkable other than mild leukocytosis and hypokalemia.  Lipase is within normal limits suspect patient's presentation is most likely due to viral gastroenteritis. For  hypokalemia was repleted with oral potassium supplement.  Patient does have signs of mild dehydration given mild tachycardia which was managed with IV bolus fluid.  Overall suspect viral gastroenteritis we will manage conservatively with Zofran for nausea, encourage patient to continue p.o. intake and close follow-up with PCP.  Patient stable for discharge and discussed return precautions discussed with patient.  Problems Addressed: Dehydration: acute illness or injury Hypokalemia: acute illness or injury Nausea vomiting and diarrhea: acute illness or injury  Amount and/or Complexity of Data Reviewed Labs: ordered.     Details: Labs ordered and reviewed by me.  Notable lab finding includes potassium 3.2, creatinine 1.12 and WBC 11.2.  Lipase was within normal limit. Radiology:     Details: No imaging ordered.  Risk OTC drugs. Prescription drug management.     Final Clinical Impression(s) / ED Diagnoses Final diagnoses:  Nausea vomiting and diarrhea  Dehydration  Hypokalemia    Rx / DC Orders ED Discharge Orders          Ordered    ondansetron (ZOFRAN) 4 MG tablet  Every 8 hours PRN        12/15/22 0917              Jerre Simon, MD 12/15/22 1122    Jerre Simon, MD 12/15/22 1126  Lowther, Amy, DO 12/22/22 1749

## 2022-12-15 NOTE — Discharge Instructions (Addendum)
Please continue to maintain good oral hydration and eat foods high in potassium.  We have sent Zofran to the pharmacy to help with your nausea and vomiting.  Please follow-up with your PCP for reevaluation.  Return to the emergency department if you have any persistent symptoms or worsening symptoms.

## 2023-02-17 ENCOUNTER — Encounter (HOSPITAL_COMMUNITY): Payer: Self-pay

## 2023-02-17 ENCOUNTER — Emergency Department (HOSPITAL_COMMUNITY)
Admission: EM | Admit: 2023-02-17 | Discharge: 2023-02-17 | Disposition: A | Discharge status: Home / Self Care | Payer: MEDICAID | Attending: Emergency Medicine | Admitting: Emergency Medicine

## 2023-02-17 ENCOUNTER — Other Ambulatory Visit: Payer: Self-pay

## 2023-02-17 ENCOUNTER — Emergency Department (HOSPITAL_COMMUNITY): Payer: MEDICAID

## 2023-02-17 DIAGNOSIS — R1032 Left lower quadrant pain: Secondary | ICD-10-CM | POA: Diagnosis not present

## 2023-02-17 LAB — CBC
HCT: 42.8 % (ref 36.0–46.0)
Hemoglobin: 14.5 g/dL (ref 12.0–15.0)
MCH: 31.9 pg (ref 26.0–34.0)
MCHC: 33.9 g/dL (ref 30.0–36.0)
MCV: 94.1 fL (ref 80.0–100.0)
Platelets: 327 10*3/uL (ref 150–400)
RBC: 4.55 MIL/uL (ref 3.87–5.11)
RDW: 12.5 % (ref 11.5–15.5)
WBC: 12 10*3/uL — ABNORMAL HIGH (ref 4.0–10.5)
nRBC: 0 % (ref 0.0–0.2)

## 2023-02-17 LAB — COMPREHENSIVE METABOLIC PANEL
ALT: 25 U/L (ref 0–44)
AST: 17 U/L (ref 15–41)
Albumin: 3.5 g/dL (ref 3.5–5.0)
Alkaline Phosphatase: 99 U/L (ref 38–126)
Anion gap: 10 (ref 5–15)
BUN: 9 mg/dL (ref 6–20)
CO2: 22 mmol/L (ref 22–32)
Calcium: 8.9 mg/dL (ref 8.9–10.3)
Chloride: 104 mmol/L (ref 98–111)
Creatinine, Ser: 0.91 mg/dL (ref 0.44–1.00)
GFR, Estimated: >60 mL/min (ref >60)
Glucose, Bld: 116 mg/dL — ABNORMAL HIGH (ref 70–99)
Potassium: 3.4 mmol/L — ABNORMAL LOW (ref 3.5–5.1)
Sodium: 136 mmol/L (ref 135–145)
Total Bilirubin: 0.4 mg/dL (ref 0.3–1.2)
Total Protein: 6.2 g/dL — ABNORMAL LOW (ref 6.5–8.1)

## 2023-02-17 LAB — URINALYSIS, ROUTINE W REFLEX MICROSCOPIC
Bilirubin Urine: NEGATIVE
Glucose, UA: NEGATIVE mg/dL
Hgb urine dipstick: NEGATIVE
Ketones, ur: NEGATIVE mg/dL
Leukocytes,Ua: NEGATIVE
Nitrite: NEGATIVE
Protein, ur: NEGATIVE mg/dL
Specific Gravity, Urine: 1.02 (ref 1.005–1.030)
pH: 6 (ref 5.0–8.0)

## 2023-02-17 LAB — LIPASE, BLOOD: Lipase: 34 U/L (ref 11–51)

## 2023-02-17 LAB — HCG, SERUM, QUALITATIVE: Preg, Serum: NEGATIVE

## 2023-02-17 MED ORDER — OXYCODONE-ACETAMINOPHEN 5-325 MG PO TABS
1.0000 | ORAL_TABLET | Freq: Four times a day (QID) | ORAL | Status: DC | PRN
  Administered 2023-02-17: 1 via ORAL
  Filled 2023-02-17: qty 1

## 2023-02-17 MED ORDER — POTASSIUM CHLORIDE CRYS ER 20 MEQ PO TBCR
20.0000 meq | EXTENDED_RELEASE_TABLET | Freq: Once | ORAL | Status: AC
  Administered 2023-02-17: 20 meq via ORAL
  Filled 2023-02-17: qty 1

## 2023-02-17 MED ORDER — IOHEXOL 350 MG/ML SOLN
75.0000 mL | Freq: Once | INTRAVENOUS | Status: AC | PRN
  Administered 2023-02-17: 75 mL via INTRAVENOUS

## 2023-02-17 NOTE — Discharge Instructions (Addendum)
It was a pleasure caring for you today.  Transvaginal ultrasound and CT abdomen without concern for any acute process that would be contributing to your symptoms.  Pregnancy test negative.  Urinalysis without concern for infection.  Rest of your blood lab looks reassuring today.  Recommend following up with your gynecologist to further evaluate for ovarian cyst.  Seek emergency care if experiencing any new or worsening symptoms.  Alternating between 650 mg Tylenol and 400 mg Advil: The best way to alternate taking Acetaminophen (example Tylenol) and Ibuprofen (example Advil/Motrin) is to take them 3 hours apart. For example, if you take ibuprofen at 6 am you can then take Tylenol at 9 am. You can continue this regimen throughout the day, making sure you do not exceed the recommended maximum dose for each drug.

## 2023-02-17 NOTE — ED Provider Notes (Signed)
EMERGENCY DEPARTMENT AT [facility] Provider Note   CSN: [id number] Arrival date & time: 02/17/23  0602     History {Add pertinent medical, surgical, social history, OB history to HPI:1} Chief Complaint  Patient presents with   Abdominal Pain    Carrie Peterson is a 27 y.o. G3P3 female with PMHx ovarian cyst who presents to ED concerned for LLQ cramping/contraction pain x3 days. Patient states that she felt 3 pops, and pain has been increasing since. Pain now radiating towards lower back. Endorses dyspareunia x3 days. Also endorses non-bloody diarrhea twice in the past 3 days. No STI history - currently denying vaginal discharge/irritation. 1 partner in the past 12 mo -  no condom use. Partner without concern for STD.  Denies fever, chest pain, dyspnea, nausea, vomiting, hematochezia, hematuria, dysuria, abnormal vaginal bleeding.   Abdominal Pain      Home Medications Prior to Admission medications   Medication Sig Start Date End Date Taking? Authorizing Provider  albuterol (PROVENTIL) (2.5 MG/3ML) 0.083% nebulizer solution Take 3 mLs (2.5 mg total) by nebulization every 6 (six) hours as needed for wheezing or shortness of breath. 09/23/20   Concha Se, MD  ondansetron (ZOFRAN) 4 MG tablet Take 1 tablet (4 mg total) by mouth every 8 (eight) hours as needed for nausea or vomiting. 12/15/22   Lowther, Amy, DO  Prenatal Vit-Fe Fumarate-FA (PRENATAL VITAMIN PLUS LOW IRON) 27-1 MG TABS Please specify directions, refills and quantity 06/03/18   Aviva Signs, CNM      Allergies    Patient has no known allergies.    Review of Systems   Review of Systems  Gastrointestinal:  Positive for abdominal pain.    Physical Exam Updated Vital Signs BP 122/67 (BP Location: Right Arm)   Pulse 92   Temp 98.7 F (37.1 C) (Oral)   Resp 19   LMP 01/15/2023 (Approximate)   SpO2 99%  Physical Exam Vitals and nursing note reviewed.  Constitutional:      General:  She is not in acute distress.    Appearance: She is not ill-appearing or toxic-appearing.  HENT:     Head: Normocephalic and atraumatic.     Mouth/Throat:     Mouth: Mucous membranes are moist.     Pharynx: No oropharyngeal exudate or posterior oropharyngeal erythema.  Eyes:     General: No scleral icterus.       Right eye: No discharge.        Left eye: No discharge.     Conjunctiva/sclera: Conjunctivae normal.  Cardiovascular:     Rate and Rhythm: Normal rate and regular rhythm.     Pulses: Normal pulses.     Heart sounds: No murmur heard. Pulmonary:     Effort: Pulmonary effort is normal.  Abdominal:     General: Abdomen is flat. Bowel sounds are normal. There is no distension.     Palpations: Abdomen is soft. There is no mass.     Tenderness: There is generalized abdominal tenderness.     Comments: Generalized tenderness to palpation that is increased over LLQ. No masses palpated. No bruising.  Musculoskeletal:     Right lower leg: No edema.     Left lower leg: No edema.  Skin:    General: Skin is warm and dry.     Findings: No rash.  Neurological:     General: No focal deficit present.     Mental Status: She is alert. Mental status is at baseline.  Psychiatric:        Mood and Affect: Mood normal.     ED Results / Procedures / Treatments   Labs (all labs ordered are listed, but only abnormal results are displayed) Labs Reviewed  LIPASE, BLOOD  COMPREHENSIVE METABOLIC PANEL  CBC  URINALYSIS, ROUTINE W REFLEX MICROSCOPIC  HCG, SERUM, QUALITATIVE    EKG None  Radiology No results found.  Procedures Procedures  {Document cardiac monitor, telemetry assessment procedure when appropriate:1}  Medications Ordered in ED Medications - No data to display  ED Course/ Medical Decision Making/ A&P   {   Click here for ABCD2, HEART and other calculatorsREFRESH Note before signing :1}                          Medical Decision Making Amount and/or Complexity of  Data Reviewed Labs: ordered. Radiology: ordered.  Risk Prescription drug management.    This patient presents to the ED for concern of LLQ abdominal pain, this involves an extensive number of treatment options, and is a complaint that carries with it a high risk of complications and morbidity.  The differential diagnosis includes gastroenteritis, colitis, small bowel obstruction, appendicitis, cholecystitis, pancreatitis, nephrolithiasis, UTI, pyleonephritis, ruptured ectopic pregnancy, PID, ovarian torsion.   Co morbidities that complicate the patient evaluation  none    Lab Tests:  I Ordered, and personally interpreted labs.  The pertinent results include:   CBC with differential: Mild leukocytosis; no anemia CMP: Mild hypokalemia Lipase: Within normal limits UA: Without concern for infection hCG: Negative    Imaging Studies ordered:  I ordered imaging studies including  - CT Abd/Pelvis with contrast: evaluate for structural/surgical etiology of patients' severe abdominal pain.  - Pelvic US to evaluate for torsion  I independently visualized and interpreted imaging I agree with the radiologist interpretation   Consultations Obtained:  I requested consultation with the ***,  and discussed lab and imaging findings as well as pertinent plan - they recommend: ***   Problem List / ED Course / Critical interventions / Medication management  Patient presented for LLQ abdominal pain.  Physical exam with generalized tenderness to palpation of the abdomen with increased tenderness to the LLQ.  UA without concern for infection.  BMP with mild hypokalemia-provided patient with a oral potassium supplement in ED.  CBC with mild leukocytosis.  No anemia on CBC.  Lipase within normal limits.  Pregnancy test negative.  Ultrasound without any concern for ovarian complications. Patient declined wanting pelvic exam today. I have reviewed the patients home medicines and have made  adjustments as needed Patient was given return precautions. Patient stable for discharge at this time.  Patient verbalized understanding of plan.  Ddx: These are considered less likely due to history of present illness and physical exam. -gastroenteritis: No vomiting in ED; no fever; rating p.o. intake -colitis: Denies diarrhea, patient afebrile  -small bowel obstruction: Last BM yesterday -appendicitis: Negative McBurney point, rebound tenderness, psoas, obturator sign  -cholecystitis: Negative Murphy sign; liver enzymes within normal limits -pancreatitis: No LUQ tenderness to palpation, lipase within normal limits -nephrolithiasis: Denies flank pain and urinary complaints  -UTI/pyelonephritis: Denies urinary complaints  -ruptured ectopic pregnancy, PID, ovarian torsion: transvaginal US without concern   Social Determinants of Health:  none    {Document critical care time when appropriate:1} {Document review of labs and clinical decision tools ie heart score, Chads2Vasc2 etc:1}  {Document your independent review of radiology images, and any outside records:1} {Document your  discussion with family members, caretakers, and with consultants:1} {Document social determinants of health affecting pt's care:1} {Document your decision making why or why not admission, treatments were needed:1} Final Clinical Impression(s) / ED Diagnoses Final diagnoses:  None    Rx / DC Orders ED Discharge Orders     None

## 2023-02-17 NOTE — ED Triage Notes (Signed)
Pt arrived from home viaPOV c/o abd pain 8/10. Pt states that she felt 3 pops in the mid lower abd 3 days ago and has had abd since. Denies n/v. Pt states that she had had diarrhea x 2  in the last 3 days.

## 2023-02-28 ENCOUNTER — Emergency Department (HOSPITAL_COMMUNITY)
Admission: EM | Admit: 2023-02-28 | Discharge: 2023-02-28 | Discharge status: Against Medical Advice | Payer: MEDICAID | Attending: Emergency Medicine | Admitting: Emergency Medicine

## 2023-02-28 DIAGNOSIS — Z5321 Procedure and treatment not carried out due to patient leaving prior to being seen by health care provider: Secondary | ICD-10-CM | POA: Diagnosis not present

## 2023-02-28 DIAGNOSIS — I10 Essential (primary) hypertension: Secondary | ICD-10-CM | POA: Insufficient documentation

## 2023-03-01 ENCOUNTER — Encounter (HOSPITAL_COMMUNITY): Payer: Self-pay

## 2023-03-01 ENCOUNTER — Emergency Department (HOSPITAL_COMMUNITY)
Admission: EM | Admit: 2023-03-01 | Discharge: 2023-03-02 | Discharge status: Against Medical Advice | Payer: MEDICAID | Attending: Emergency Medicine | Admitting: Emergency Medicine

## 2023-03-01 ENCOUNTER — Other Ambulatory Visit: Payer: Self-pay

## 2023-03-01 DIAGNOSIS — Z5321 Procedure and treatment not carried out due to patient leaving prior to being seen by health care provider: Secondary | ICD-10-CM | POA: Diagnosis not present

## 2023-03-01 DIAGNOSIS — R600 Localized edema: Secondary | ICD-10-CM | POA: Diagnosis not present

## 2023-03-01 DIAGNOSIS — R1110 Vomiting, unspecified: Secondary | ICD-10-CM | POA: Diagnosis not present

## 2023-03-01 NOTE — ED Triage Notes (Signed)
Pt arrived from home via POV c/o emesis x 4 days. Pt states that she feels swollen all over also. Zero noted pitting edema in triage. Denies abd pain at present.

## 2023-03-02 LAB — URINALYSIS, ROUTINE W REFLEX MICROSCOPIC
Bilirubin Urine: NEGATIVE
Glucose, UA: NEGATIVE mg/dL
Hgb urine dipstick: NEGATIVE
Ketones, ur: NEGATIVE mg/dL
Nitrite: NEGATIVE
Protein, ur: 30 mg/dL — AB
Specific Gravity, Urine: 1.021 (ref 1.005–1.030)
pH: 6 (ref 5.0–8.0)

## 2023-03-02 LAB — CBC
HCT: 41.3 % (ref 36.0–46.0)
Hemoglobin: 14.2 g/dL (ref 12.0–15.0)
MCH: 31.9 pg (ref 26.0–34.0)
MCHC: 34.4 g/dL (ref 30.0–36.0)
MCV: 92.8 fL (ref 80.0–100.0)
Platelets: 337 10*3/uL (ref 150–400)
RBC: 4.45 MIL/uL (ref 3.87–5.11)
RDW: 12.4 % (ref 11.5–15.5)
WBC: 14.4 10*3/uL — ABNORMAL HIGH (ref 4.0–10.5)
nRBC: 0 % (ref 0.0–0.2)

## 2023-03-02 LAB — COMPREHENSIVE METABOLIC PANEL
ALT: 37 U/L (ref 0–44)
AST: 29 U/L (ref 15–41)
Albumin: 3.6 g/dL (ref 3.5–5.0)
Alkaline Phosphatase: 115 U/L (ref 38–126)
Anion gap: 9 (ref 5–15)
BUN: 9 mg/dL (ref 6–20)
CO2: 26 mmol/L (ref 22–32)
Calcium: 9.2 mg/dL (ref 8.9–10.3)
Chloride: 104 mmol/L (ref 98–111)
Creatinine, Ser: 0.77 mg/dL (ref 0.44–1.00)
GFR, Estimated: >60 mL/min (ref >60)
Glucose, Bld: 106 mg/dL — ABNORMAL HIGH (ref 70–99)
Potassium: 3.8 mmol/L (ref 3.5–5.1)
Sodium: 139 mmol/L (ref 135–145)
Total Bilirubin: 0.5 mg/dL (ref 0.3–1.2)
Total Protein: 6.6 g/dL (ref 6.5–8.1)

## 2023-03-02 LAB — LIPASE, BLOOD: Lipase: 32 U/L (ref 11–51)

## 2023-03-02 LAB — HCG, SERUM, QUALITATIVE: Preg, Serum: NEGATIVE

## 2023-03-02 NOTE — ED Provider Notes (Signed)
Apparently patient left the ED prior to being evaluated by a provider.    Carrie Peterson, Barbara Cower, MD 03/02/23 [phone]

## 2023-03-16 ENCOUNTER — Emergency Department (HOSPITAL_COMMUNITY): Payer: MEDICAID

## 2023-03-16 ENCOUNTER — Emergency Department (HOSPITAL_COMMUNITY)
Admission: EM | Admit: 2023-03-16 | Discharge: 2023-03-17 | Disposition: A | Discharge status: Home / Self Care | Payer: MEDICAID | Attending: Emergency Medicine | Admitting: Emergency Medicine

## 2023-03-16 ENCOUNTER — Encounter (HOSPITAL_COMMUNITY): Payer: Self-pay

## 2023-03-16 DIAGNOSIS — R109 Unspecified abdominal pain: Secondary | ICD-10-CM | POA: Diagnosis not present

## 2023-03-16 DIAGNOSIS — M546 Pain in thoracic spine: Secondary | ICD-10-CM | POA: Diagnosis not present

## 2023-03-16 DIAGNOSIS — M25512 Pain in left shoulder: Secondary | ICD-10-CM | POA: Diagnosis not present

## 2023-03-16 DIAGNOSIS — R079 Chest pain, unspecified: Secondary | ICD-10-CM

## 2023-03-16 DIAGNOSIS — R197 Diarrhea, unspecified: Secondary | ICD-10-CM | POA: Insufficient documentation

## 2023-03-16 DIAGNOSIS — F17210 Nicotine dependence, cigarettes, uncomplicated: Secondary | ICD-10-CM | POA: Diagnosis not present

## 2023-03-16 DIAGNOSIS — R112 Nausea with vomiting, unspecified: Secondary | ICD-10-CM | POA: Diagnosis present

## 2023-03-16 DIAGNOSIS — R0602 Shortness of breath: Secondary | ICD-10-CM | POA: Insufficient documentation

## 2023-03-16 DIAGNOSIS — R0789 Other chest pain: Secondary | ICD-10-CM | POA: Insufficient documentation

## 2023-03-16 LAB — I-STAT CG4 LACTIC ACID, ED: Lactic Acid, Venous: 1.4 mmol/L (ref 0.5–1.9)

## 2023-03-16 MED ORDER — MORPHINE SULFATE (PF) 4 MG/ML IV SOLN
4.0000 mg | Freq: Once | INTRAVENOUS | Status: AC
  Administered 2023-03-16: 4 mg via INTRAVENOUS
  Filled 2023-03-16: qty 1

## 2023-03-16 MED ORDER — ONDANSETRON HCL 4 MG/2ML IJ SOLN
4.0000 mg | Freq: Once | INTRAMUSCULAR | Status: AC
  Administered 2023-03-16: 4 mg via INTRAVENOUS
  Filled 2023-03-16: qty 2

## 2023-03-16 MED ORDER — SODIUM CHLORIDE 0.9 % IV BOLUS
1000.0000 mL | Freq: Once | INTRAVENOUS | Status: AC
  Administered 2023-03-16: 1000 mL via INTRAVENOUS

## 2023-03-16 MED ORDER — ONDANSETRON HCL 4 MG/2ML IJ SOLN: 4.0000 mg | Freq: Once | INTRAMUSCULAR | Status: DC | PRN

## 2023-03-16 NOTE — ED Provider Notes (Signed)
[facility] DEPT [facility] Provider Note MRN:  [id number]  Arrival date & time: 03/17/23     Chief Complaint   Emesis and Chest Pain   History of Present Illness   Carrie Peterson is a 27 y.o. year-old female with no pertinent past medical history presenting to the ED with chief complaint of emesis and chest pain.  Patient has been having persistent nausea vomiting and diarrhea for 2 weeks.  She was here in the emergency department and had a reassuring workup.  Continues to feel unwell, nauseated, continued abdominal discomfort.  This evening with acute onset shortness of breath, central chest pressure that is moderate to severe.  Also sharp left thoracic/left shoulder blade pain that is worse with deep breaths.  Review of Systems  A thorough review of systems was obtained and all systems are negative except as noted in the HPI and PMH.   Patient's Health History    Past Medical History:  Diagnosis Date   Anxiety    Depression    Fracture 02/13/2014   Back, 5 years ago.   PTSD (post-traumatic stress disorder)    Ulcer     Past Surgical History:  Procedure Laterality Date   addenoidectomy     TONSILLECTOMY AND ADENOIDECTOMY      Family History  Problem Relation Age of Onset   Alcohol abuse Father    Anxiety disorder Father    Bipolar disorder Father    Depression Father    Drug abuse Father    Diabetes Father    Hypertension Father    Depression Mother    Multiple sclerosis Mother    COPD Maternal Uncle    Cancer Maternal Grandmother    COPD Maternal Grandmother    Arthritis Neg Hx    Asthma Neg Hx    Birth defects Neg Hx    Early death Neg Hx    Hearing loss Neg Hx    Heart disease Neg Hx    Kidney disease Neg Hx    Hyperlipidemia Neg Hx    Learning disabilities Neg Hx    Mental illness Neg Hx    Mental retardation Neg Hx    Miscarriages / Stillbirths Neg Hx    Stroke Neg Hx    Vision loss Neg Hx     Social History    Socioeconomic History   Marital status: Single    Spouse name: Not on file   Number of children: Not on file   Years of education: Not on file   Highest education level: Not on file  Occupational History   Occupation: part time  Tobacco Use   Smoking status: Every Day    Current packs/day: 0.50    Types: Cigarettes   Smokeless tobacco: Never  Vaping Use   Vaping status: Former   Quit date: 05/29/2017  Substance and Sexual Activity   Alcohol use: No   Drug use: No   Sexual activity: Yes  Other Topics Concern   Not on file  Social History Narrative   Not on file   Social Determinants of Health   Financial Resource Strain: Not on File (04/04/2018)   Received from [employer]    Financial Resource Strain: 0  Food Insecurity: Not on File (04/04/2018)   Received from [facility] Insecurity    Food: 0  Transportation Needs: Not on File (04/04/2018)   Received from [employer] Needs    Transportation: 0  Physical  Activity: Not on File (04/04/2018)   Received from [facility]   Physical Activity    Physical Activity: 0  Stress: Not on File (04/04/2018)   Received from [facility]   Stress    Stress: 0  Social Connections: Not on File (04/04/2018)   Received from [facility]   Social Connections    Social Connections and Isolation: 0  Intimate Partner Violence: Unknown (07/15/2017)   Humiliation, Afraid, Rape, and Kick questionnaire    Fear of Current or Ex-Partner: Patient declined    Emotionally Abused: Patient declined    Physically Abused: Patient declined    Sexually Abused: Patient declined     Physical Exam   Vitals:   03/17/23 0015 03/17/23 0045  BP: 114/75 107/73  Pulse: 61 (!) 54  Resp: (!) 22 (!) 22  Temp:    SpO2: 99% 100%    CONSTITUTIONAL: Well-appearing, NAD NEURO/PSYCH:  Alert and oriented x 3, no focal deficits EYES:  eyes equal and reactive ENT/NECK:  no LAD, no JVD CARDIO: Regular rate, well-perfused, normal S1 and  S2 PULM:  CTAB no wheezing or rhonchi GI/GU:  non-distended, non-tender MSK/SPINE:  No gross deformities, no edema SKIN:  no rash, atraumatic   *Additional and/or pertinent findings included in MDM below  Diagnostic and Interventional Summary    EKG Interpretation Date/Time:  Tuesday March 16 2023 23:02:42 EDT Ventricular Rate:  56 PR Interval:  129 QRS Duration:  95 QT Interval:  444 QTC Calculation: 429 R Axis:   73  Text Interpretation: Sinus rhythm Borderline T abnormalities, anterior leads Confirmed by Kennis Carina [phone]) on 03/16/2023 11:16:08 PM       Labs Reviewed  CBC - Abnormal; Notable for the following components:      Result Value   WBC 15.4 (*)    All other components within normal limits  COMPREHENSIVE METABOLIC PANEL - Abnormal; Notable for the following components:   Potassium 3.3 (*)    CO2 17 (*)    Glucose, Bld 135 (*)    All other components within normal limits  HCG, SERUM, QUALITATIVE  LIPASE, BLOOD  MAGNESIUM  I-STAT CG4 LACTIC ACID, ED  I-STAT CG4 LACTIC ACID, ED  TROPONIN I (HIGH SENSITIVITY)  TROPONIN I (HIGH SENSITIVITY)    CT Angio Chest Pulmonary Embolism (PE) W or WO Contrast  Final Result    DG Chest Port 1 View  Final Result      Medications  sodium chloride 0.9 % bolus 1,000 mL (0 mLs Intravenous Stopped 03/17/23 0119)  ondansetron (ZOFRAN) injection 4 mg (4 mg Intravenous Given 03/16/23 2336)  morphine (PF) 4 MG/ML injection 4 mg (4 mg Intravenous Given 03/16/23 2336)  iohexol (OMNIPAQUE) 350 MG/ML injection 75 mL (75 mLs Intravenous Contrast Given 03/17/23 0044)     Procedures  /  Critical Care Procedures  ED Course and Medical Decision Making  Initial Impression and Ddx With prodrome nausea vomiting diarrhea and now having chest pain and shortness of breath myocarditis is considered.  With 2 weeks of countless episodes of vomiting now presenting with chest pain and shortness of breath Boerhaave's is also considered.  Other  considerations include PE, GERD related pain, dehydration, AKI, electrolyte disturbance  Past medical/surgical history that increases complexity of ED encounter: None  Interpretation of Diagnostics I personally reviewed the EKG and my interpretation is as follows: Sinus bradycardia  Labs overall reassuring, mild acidosis, leukocytosis, otherwise no significant blood count or electrolyte disturbance, troponin negative  Patient Reassessment and Ultimate Disposition/Management  CT imaging unremarkable.  Patient feeling much better on reassessment, normal vital signs, no acute distress, soft abdomen.  Appropriate for discharge with augmented symptomatic management at home.  Patient management required discussion with the following services or consulting groups:  None  Complexity of Problems Addressed Acute illness or injury that poses threat of life of bodily function  Additional Data Reviewed and Analyzed Further history obtained from: Prior labs/imaging results  Additional Factors Impacting ED Encounter Risk Prescriptions  Elmer Sow. Pilar Plate, MD [facility] Emergency Medicine [facility] [email or web address]  Final Clinical Impressions(s) / ED Diagnoses     ICD-10-CM   1. Nausea and vomiting, unspecified vomiting type  R11.2     2. Chest pain, unspecified type  R07.9       ED Discharge Orders          Ordered    ondansetron (ZOFRAN-ODT) 4 MG disintegrating tablet  Every 8 hours PRN        03/17/23 0220    dicyclomine (BENTYL) 20 MG tablet  2 times daily        03/17/23 0220    sucralfate (CARAFATE) 1 g tablet  4 times daily PRN        03/17/23 0220    loperamide (IMODIUM) 2 MG capsule  4 times daily PRN        03/17/23 0220             Discharge Instructions Discussed with and Provided to Patient:     Discharge Instructions      You were evaluated in the Emergency Department and after careful evaluation, we did not find any emergent  condition requiring admission or further testing in the hospital.  Your exam/testing today is overall reassuring.  Use Zofran as needed for nausea, use the Carafate as needed for chest discomfort, use the Bentyl as needed for crampy abdominal pain, use the Imodium as needed for diarrhea.  Follow-up with the primary care doctor.  Please return to the Emergency Department if you experience any worsening of your condition.   Thank you for allowing [place] to be a part of your care.       Sabas Sous, MD 03/17/23 [phone]

## 2023-03-16 NOTE — ED Triage Notes (Signed)
Patient arrives via EMS for N/V for two weeks. Patient was seen here previously for it. Patient had a CT and everything appeared normal. Patient was sent home with no meds. Patient states she is unable to keep anything down and symptoms have progressively gotten work. Patient also feels weak. EMS reports chest wall pain. Patient appears pale and diaphoretic. Patient had elevated HR upon standing from 80 to 130.

## 2023-03-17 ENCOUNTER — Emergency Department (HOSPITAL_COMMUNITY): Payer: MEDICAID

## 2023-03-17 MED ORDER — LOPERAMIDE HCL 2 MG PO CAPS: 2.0000 mg | ORAL_CAPSULE | Freq: Four times a day (QID) | ORAL | 0 refills | Status: DC | PRN

## 2023-03-17 MED ORDER — ONDANSETRON 4 MG PO TBDP: 4.0000 mg | ORAL_TABLET | Freq: Three times a day (TID) | ORAL | 0 refills | Status: DC | PRN

## 2023-03-17 MED ORDER — SUCRALFATE 1 G PO TABS: 1.0000 g | ORAL_TABLET | Freq: Four times a day (QID) | ORAL | 0 refills | Status: DC | PRN

## 2023-03-17 MED ORDER — IOHEXOL 350 MG/ML SOLN
75.0000 mL | Freq: Once | INTRAVENOUS | Status: AC | PRN
  Administered 2023-03-17: 75 mL via INTRAVENOUS

## 2023-03-17 MED ORDER — DICYCLOMINE HCL 20 MG PO TABS: 20.0000 mg | ORAL_TABLET | Freq: Two times a day (BID) | ORAL | 0 refills | Status: DC

## 2023-03-17 NOTE — Discharge Instructions (Signed)
You were evaluated in the Emergency Department and after careful evaluation, we did not find any emergent condition requiring admission or further testing in the hospital.  Your exam/testing today is overall reassuring.  Use Zofran as needed for nausea, use the Carafate as needed for chest discomfort, use the Bentyl as needed for crampy abdominal pain, use the Imodium as needed for diarrhea.  Follow-up with the primary care doctor.  Please return to the Emergency Department if you experience any worsening of your condition.   Thank you for allowing [place] to be a part of your care.

## 2023-03-24 ENCOUNTER — Encounter (HOSPITAL_COMMUNITY): Payer: Self-pay

## 2023-03-24 ENCOUNTER — Emergency Department (HOSPITAL_COMMUNITY)
Admission: EM | Admit: 2023-03-24 | Discharge: 2023-03-24 | Disposition: A | Discharge status: Home / Self Care | Payer: MEDICAID

## 2023-03-24 ENCOUNTER — Other Ambulatory Visit: Payer: Self-pay

## 2023-03-24 DIAGNOSIS — F1210 Cannabis abuse, uncomplicated: Secondary | ICD-10-CM | POA: Insufficient documentation

## 2023-03-24 DIAGNOSIS — R112 Nausea with vomiting, unspecified: Secondary | ICD-10-CM | POA: Diagnosis not present

## 2023-03-24 DIAGNOSIS — F1310 Sedative, hypnotic or anxiolytic abuse, uncomplicated: Secondary | ICD-10-CM | POA: Insufficient documentation

## 2023-03-24 DIAGNOSIS — R0981 Nasal congestion: Secondary | ICD-10-CM | POA: Insufficient documentation

## 2023-03-24 DIAGNOSIS — F1110 Opioid abuse, uncomplicated: Secondary | ICD-10-CM | POA: Diagnosis not present

## 2023-03-24 DIAGNOSIS — R197 Diarrhea, unspecified: Secondary | ICD-10-CM | POA: Diagnosis not present

## 2023-03-24 DIAGNOSIS — Z20822 Contact with and (suspected) exposure to covid-19: Secondary | ICD-10-CM | POA: Diagnosis not present

## 2023-03-24 LAB — CBC
HCT: 42 % (ref 36.0–46.0)
Hemoglobin: 14.2 g/dL (ref 12.0–15.0)
MCH: 31.4 pg (ref 26.0–34.0)
MCHC: 33.8 g/dL (ref 30.0–36.0)
MCV: 92.9 fL (ref 80.0–100.0)
Platelets: 361 10*3/uL (ref 150–400)
RBC: 4.52 MIL/uL (ref 3.87–5.11)
RDW: 11.9 % (ref 11.5–15.5)
WBC: 11.9 10*3/uL — ABNORMAL HIGH (ref 4.0–10.5)
nRBC: 0 % (ref 0.0–0.2)

## 2023-03-24 LAB — COMPREHENSIVE METABOLIC PANEL
ALT: 13 U/L (ref 0–44)
AST: 15 U/L (ref 15–41)
Albumin: 3.5 g/dL (ref 3.5–5.0)
Alkaline Phosphatase: 97 U/L (ref 38–126)
Anion gap: 10 (ref 5–15)
BUN: 7 mg/dL (ref 6–20)
CO2: 21 mmol/L — ABNORMAL LOW (ref 22–32)
Calcium: 9.1 mg/dL (ref 8.9–10.3)
Chloride: 110 mmol/L (ref 98–111)
Creatinine, Ser: 1.12 mg/dL — ABNORMAL HIGH (ref 0.44–1.00)
GFR, Estimated: >60 mL/min (ref >60)
Glucose, Bld: 113 mg/dL — ABNORMAL HIGH (ref 70–99)
Potassium: 3.3 mmol/L — ABNORMAL LOW (ref 3.5–5.1)
Sodium: 141 mmol/L (ref 135–145)
Total Bilirubin: 0.4 mg/dL (ref 0.3–1.2)
Total Protein: 6.6 g/dL (ref 6.5–8.1)

## 2023-03-24 LAB — RESP PANEL BY RT-PCR (RSV, FLU A&B, COVID)  RVPGX2
Influenza A by PCR: NEGATIVE
Influenza B by PCR: NEGATIVE
Resp Syncytial Virus by PCR: NEGATIVE
SARS Coronavirus 2 by RT PCR: NEGATIVE

## 2023-03-24 LAB — LIPASE, BLOOD: Lipase: 41 U/L (ref 11–51)

## 2023-03-24 LAB — URINALYSIS, ROUTINE W REFLEX MICROSCOPIC
Bilirubin Urine: NEGATIVE
Glucose, UA: NEGATIVE mg/dL
Ketones, ur: NEGATIVE mg/dL
Leukocytes,Ua: NEGATIVE
Nitrite: NEGATIVE
Protein, ur: NEGATIVE mg/dL
Specific Gravity, Urine: 1.02 (ref 1.005–1.030)
pH: 7 (ref 5.0–8.0)

## 2023-03-24 LAB — RAPID URINE DRUG SCREEN, HOSP PERFORMED
Amphetamines: NOT DETECTED
Barbiturates: NOT DETECTED
Benzodiazepines: POSITIVE — AB
Cocaine: NOT DETECTED
Opiates: POSITIVE — AB
Tetrahydrocannabinol: POSITIVE — AB

## 2023-03-24 LAB — RAPID HIV SCREEN (HIV 1/2 AB+AG)
HIV 1/2 Antibodies: NONREACTIVE
HIV-1 P24 Antigen - HIV24: NONREACTIVE

## 2023-03-24 LAB — URINALYSIS, MICROSCOPIC (REFLEX)

## 2023-03-24 LAB — WET PREP, GENITAL
Clue Cells Wet Prep HPF POC: NONE SEEN
Sperm: NONE SEEN
Trich, Wet Prep: NONE SEEN
WBC, Wet Prep HPF POC: <10 (ref <10)
Yeast Wet Prep HPF POC: NONE SEEN

## 2023-03-24 MED ORDER — ALUM & MAG HYDROXIDE-SIMETH 200-200-20 MG/5ML PO SUSP
30.0000 mL | Freq: Once | ORAL | Status: AC
  Administered 2023-03-24: 30 mL via ORAL
  Filled 2023-03-24: qty 30

## 2023-03-24 MED ORDER — ONDANSETRON HCL 4 MG/2ML IJ SOLN
4.0000 mg | Freq: Once | INTRAMUSCULAR | Status: AC
  Administered 2023-03-24: 4 mg via INTRAVENOUS
  Filled 2023-03-24: qty 2

## 2023-03-24 MED ORDER — LACTATED RINGERS IV BOLUS
1000.0000 mL | Freq: Once | INTRAVENOUS | Status: AC
  Administered 2023-03-24: 1000 mL via INTRAVENOUS

## 2023-03-24 NOTE — Discharge Instructions (Addendum)
May follow-up with results of pending test on MyChart.  Turn immediately felt fevers, chills, lightheadedness, worsening abdominal pain, continue to vomit blood or you develop any new or worsening symptoms that are concerning to you.  Please follow-up with your primary doctor.  We are also giving you the number to a GI doctor.

## 2023-03-24 NOTE — ED Triage Notes (Signed)
Pt c/o sob, chest heaviness, N/V/D x 3 weeks; evaluated for same on 8/6; ;tried taking tylenol and antiemetics at home but states she vomits anything that she tries to ingest

## 2023-03-24 NOTE — ED Provider Notes (Signed)
[place] EMERGENCY DEPARTMENT AT [facility] Provider Note   CSN: [id number] Arrival date & time: 03/24/23  4098     History  No chief complaint on file.   Carrie Peterson is a 27 y.o. female.  Presents emergency department subacute/chronic nausea vomiting diarrhea.  She reports symptoms been ongoing for the past 3 weeks with inability to tolerate p.o. intake.  She reports that her symptoms have not changed at all, her primary concern today as she vomited this morning and appeared to have streaks of red substance in it and that she was concerned was blood.  She denies any fevers, chills.  Continues to have some chest discomfort and intermittent shortness of breath.  She is not having abdominal pain, just nausea.  She has 4 loose watery bowel movements per day.  No blood or melena.  She reports she is in process with her PCP to find a gastroenterologist.  HPI     Home Medications Prior to Admission medications   Medication Sig Start Date End Date Taking? Authorizing Provider  albuterol (PROVENTIL) (2.5 MG/3ML) 0.083% nebulizer solution Take 3 mLs (2.5 mg total) by nebulization every 6 (six) hours as needed for wheezing or shortness of breath. Patient not taking: Reported on 02/17/2023 09/23/20   Concha Se, MD  dicyclomine (BENTYL) 20 MG tablet Take 1 tablet (20 mg total) by mouth 2 (two) times daily. 03/17/23   Sabas Sous, MD  loperamide (IMODIUM) 2 MG capsule Take 1 capsule (2 mg total) by mouth 4 (four) times daily as needed for diarrhea or loose stools. 03/17/23   Sabas Sous, MD  ondansetron (ZOFRAN) 4 MG tablet Take 1 tablet (4 mg total) by mouth every 8 (eight) hours as needed for nausea or vomiting. Patient not taking: Reported on 02/17/2023 12/15/22   Lowther, Amy, DO  ondansetron (ZOFRAN-ODT) 4 MG disintegrating tablet Take 1 tablet (4 mg total) by mouth every 8 (eight) hours as needed for nausea or vomiting. 03/17/23   Sabas Sous, MD  Prenatal Vit-Fe  Fumarate-FA (PRENATAL VITAMIN PLUS LOW IRON) 27-1 MG TABS Please specify directions, refills and quantity Patient not taking: Reported on 02/17/2023 06/03/18   Aviva Signs, CNM  sucralfate (CARAFATE) 1 g tablet Take 1 tablet (1 g total) by mouth 4 (four) times daily as needed. 03/17/23   Sabas Sous, MD      Allergies    Patient has no known allergies.    Review of Systems   Review of Systems  Physical Exam Updated Vital Signs BP (!) 106/51   Pulse 76   Temp 98.9 F (37.2 C)   Resp 19   LMP 03/12/2023 (Within Years) Comment: has implant  SpO2 100%  Physical Exam Vitals and nursing note reviewed.  Constitutional:      General: She is not in acute distress.    Appearance: She is not toxic-appearing.  HENT:     Head: Normocephalic.     Nose: Congestion present.     Mouth/Throat:     Mouth: Mucous membranes are moist.  Eyes:     Conjunctiva/sclera: Conjunctivae normal.  Cardiovascular:     Rate and Rhythm: Normal rate and regular rhythm.  Pulmonary:     Effort: Pulmonary effort is normal.  Abdominal:     General: Abdomen is flat. There is no distension.     Palpations: Abdomen is soft.     Tenderness: There is no abdominal tenderness. There is no guarding or rebound.  Skin:    General: Skin is warm.     Capillary Refill: Capillary refill takes less than 2 seconds.  Neurological:     Mental Status: She is alert.  Psychiatric:        Mood and Affect: Mood normal.        Behavior: Behavior normal.     ED Results / Procedures / Treatments   Labs (all labs ordered are listed, but only abnormal results are displayed) Labs Reviewed  CBC - Abnormal; Notable for the following components:      Result Value   WBC 11.9 (*)    All other components within normal limits  COMPREHENSIVE METABOLIC PANEL - Abnormal; Notable for the following components:   Potassium 3.3 (*)    CO2 21 (*)    Glucose, Bld 113 (*)    Creatinine, Ser 1.12 (*)    All other components within  normal limits  RESP PANEL BY RT-PCR (RSV, FLU A&B, COVID)  RVPGX2  WET PREP, GENITAL  LIPASE, BLOOD  URINALYSIS, ROUTINE W REFLEX MICROSCOPIC  RAPID URINE DRUG SCREEN, HOSP PERFORMED  PREGNANCY, URINE  RAPID HIV SCREEN (HIV 1/2 AB+AG)  GC/CHLAMYDIA PROBE AMP ([place]) NOT AT [facility]    EKG EKG Interpretation Date/Time:  Wednesday March 24 2023 09:22:52 EDT Ventricular Rate:  91 PR Interval:  142 QRS Duration:  70 QT Interval:  358 QTC Calculation: 440 R Axis:   74  Text Interpretation: Normal sinus rhythm Nonspecific T wave abnormality Abnormal ECG When compared with ECG of 24-Mar-2023 09:21, PREVIOUS ECG IS PRESENT Confirmed by Estanislado Pandy [phone]) on 03/24/2023 9:48:01 AM  Radiology No results found.  Procedures Procedures    Medications Ordered in ED Medications  lactated ringers bolus 1,000 mL (0 mLs Intravenous Stopped 03/24/23 1101)  ondansetron (ZOFRAN) injection 4 mg (4 mg Intravenous Given 03/24/23 0947)  alum & mag hydroxide-simeth (MAALOX/MYLANTA) 200-200-20 MG/5ML suspension 30 mL (30 mLs Oral Given 03/24/23 6045)    ED Course/ Medical Decision Making/ A&P                                 Medical Decision Making This is a tired appearing 27 year old female presenting emergency department for nausea vomiting diarrhea x 3 weeks.  Afebrile, nontachycardic hemodynamically stable.  Physical exam with soft nontender abdomen.  Your symptoms are largely unchanged with the exception of possible streaks of blood in her vomitus this morning.  She she has no lightheadedness, but is continue to have some chest pain shortness of breath.  Per chart review it appears she had negative CTA last visit and had negative CT of her abdomen the visit prior.  Repeated labs today showed no anemia.  With history of vomiting suspect Mallory-Weiss type tear.  She does have some mild hypokalemia and mild elevation in her creatinine.  No transaminitis to suggest hepatobiliary disease lipase is  normal.  Flu COVID-negative.  Patient is requesting to be discharged prior to completion of workup and does not want to stay for swabs.  She does also have some results pending again does not want to stay.  She states she will follow-up on MyChart and will follow-up with her primary doctor.  Given stable vitals, reassuring exam and has had negative scans within the last 2 weeks with unchanged symptoms the patient is stable for discharge.  Amount and/or Complexity of Data Reviewed Labs: ordered.  Risk OTC drugs. Prescription drug management.  Final Clinical Impression(s) / ED Diagnoses Final diagnoses:  None    Rx / DC Orders ED Discharge Orders     None         Coral Spikes, DO 03/24/23 1135

## 2023-03-24 NOTE — ED Notes (Signed)
Pt d/c home per MD order. Discharge summary reviewed with pt, pt verbalizes understanding. Ambulatory off unit. No s/s of acute distress noted at discharge.  °

## 2023-03-25 LAB — GC/CHLAMYDIA PROBE AMP (~~LOC~~) NOT AT ARMC
Chlamydia: NEGATIVE
Comment: NEGATIVE
Comment: NORMAL
Neisseria Gonorrhea: NEGATIVE

## 2023-03-26 ENCOUNTER — Emergency Department (HOSPITAL_COMMUNITY)
Admission: EM | Admit: 2023-03-26 | Discharge: 2023-03-27 | Discharge status: Against Medical Advice | Payer: MEDICAID | Attending: Emergency Medicine | Admitting: Emergency Medicine

## 2023-03-26 ENCOUNTER — Other Ambulatory Visit: Payer: Self-pay

## 2023-03-26 ENCOUNTER — Encounter (HOSPITAL_COMMUNITY): Payer: Self-pay | Admitting: *Deleted

## 2023-03-26 DIAGNOSIS — Z5321 Procedure and treatment not carried out due to patient leaving prior to being seen by health care provider: Secondary | ICD-10-CM | POA: Insufficient documentation

## 2023-03-26 DIAGNOSIS — R110 Nausea: Secondary | ICD-10-CM | POA: Insufficient documentation

## 2023-03-26 DIAGNOSIS — R109 Unspecified abdominal pain: Secondary | ICD-10-CM | POA: Diagnosis present

## 2023-03-26 LAB — CBC
HCT: 42.7 % (ref 36.0–46.0)
Hemoglobin: 14.7 g/dL (ref 12.0–15.0)
MCH: 32.2 pg (ref 26.0–34.0)
MCHC: 34.4 g/dL (ref 30.0–36.0)
MCV: 93.6 fL (ref 80.0–100.0)
Platelets: 407 10*3/uL — ABNORMAL HIGH (ref 150–400)
RBC: 4.56 MIL/uL (ref 3.87–5.11)
RDW: 11.6 % (ref 11.5–15.5)
WBC: 14.9 10*3/uL — ABNORMAL HIGH (ref 4.0–10.5)
nRBC: 0 % (ref 0.0–0.2)

## 2023-03-26 LAB — URINALYSIS, ROUTINE W REFLEX MICROSCOPIC
Bilirubin Urine: NEGATIVE
Glucose, UA: NEGATIVE mg/dL
Ketones, ur: 5 mg/dL — AB
Leukocytes,Ua: NEGATIVE
Nitrite: POSITIVE — AB
Protein, ur: 30 mg/dL — AB
Specific Gravity, Urine: 1.019 (ref 1.005–1.030)
pH: 5 (ref 5.0–8.0)

## 2023-03-26 LAB — HCG, SERUM, QUALITATIVE: Preg, Serum: NEGATIVE

## 2023-03-26 MED ORDER — ONDANSETRON 4 MG PO TBDP
8.0000 mg | ORAL_TABLET | Freq: Once | ORAL | Status: DC
  Filled 2023-03-26: qty 2

## 2023-03-26 NOTE — ED Triage Notes (Signed)
She took a percocet 1 1/2 hours ago

## 2023-03-26 NOTE — ED Notes (Signed)
Zofran was given but the computer would not take the order pt was scanned  and med given but not working

## 2023-03-26 NOTE — ED Triage Notes (Signed)
The pt reports abd and flank pain since  yesterday with nausea today no urinary frequency no bloody urine she has voided 3 hours ago  lmp   mow

## 2023-03-27 LAB — COMPREHENSIVE METABOLIC PANEL
ALT: 12 U/L (ref 0–44)
AST: 15 U/L (ref 15–41)
Albumin: 3.8 g/dL (ref 3.5–5.0)
Alkaline Phosphatase: 105 U/L (ref 38–126)
Anion gap: 11 (ref 5–15)
BUN: 11 mg/dL (ref 6–20)
CO2: 21 mmol/L — ABNORMAL LOW (ref 22–32)
Calcium: 9.3 mg/dL (ref 8.9–10.3)
Chloride: 106 mmol/L (ref 98–111)
Creatinine, Ser: 0.95 mg/dL (ref 0.44–1.00)
GFR, Estimated: >60 mL/min (ref >60)
Glucose, Bld: 95 mg/dL (ref 70–99)
Potassium: 3.5 mmol/L (ref 3.5–5.1)
Sodium: 138 mmol/L (ref 135–145)
Total Bilirubin: 1 mg/dL (ref 0.3–1.2)
Total Protein: 6.8 g/dL (ref 6.5–8.1)

## 2023-03-27 LAB — LIPASE, BLOOD: Lipase: 159 U/L — ABNORMAL HIGH (ref 11–51)

## 2023-03-27 NOTE — ED Notes (Signed)
Pt left to go to [place].

## 2023-04-25 ENCOUNTER — Other Ambulatory Visit: Payer: Self-pay

## 2023-04-25 ENCOUNTER — Encounter (HOSPITAL_COMMUNITY): Payer: Self-pay

## 2023-04-25 ENCOUNTER — Emergency Department (HOSPITAL_COMMUNITY): Payer: MEDICAID

## 2023-04-25 ENCOUNTER — Emergency Department (HOSPITAL_COMMUNITY)
Admission: EM | Admit: 2023-04-25 | Discharge: 2023-04-25 | Disposition: A | Discharge status: Home / Self Care | Payer: MEDICAID | Attending: Emergency Medicine | Admitting: Emergency Medicine

## 2023-04-25 DIAGNOSIS — J449 Chronic obstructive pulmonary disease, unspecified: Secondary | ICD-10-CM | POA: Diagnosis not present

## 2023-04-25 DIAGNOSIS — F1910 Other psychoactive substance abuse, uncomplicated: Secondary | ICD-10-CM | POA: Diagnosis present

## 2023-04-25 DIAGNOSIS — J188 Other pneumonia, unspecified organism: Secondary | ICD-10-CM | POA: Insufficient documentation

## 2023-04-25 DIAGNOSIS — R0602 Shortness of breath: Secondary | ICD-10-CM | POA: Diagnosis present

## 2023-04-25 DIAGNOSIS — Z1152 Encounter for screening for COVID-19: Secondary | ICD-10-CM | POA: Diagnosis not present

## 2023-04-25 DIAGNOSIS — J9601 Acute respiratory failure with hypoxia: Secondary | ICD-10-CM | POA: Diagnosis not present

## 2023-04-25 DIAGNOSIS — J45909 Unspecified asthma, uncomplicated: Secondary | ICD-10-CM | POA: Insufficient documentation

## 2023-04-25 DIAGNOSIS — Z7951 Long term (current) use of inhaled steroids: Secondary | ICD-10-CM | POA: Diagnosis not present

## 2023-04-25 DIAGNOSIS — F32A Depression, unspecified: Secondary | ICD-10-CM | POA: Diagnosis present

## 2023-04-25 DIAGNOSIS — J189 Pneumonia, unspecified organism: Principal | ICD-10-CM | POA: Insufficient documentation

## 2023-04-25 DIAGNOSIS — F419 Anxiety disorder, unspecified: Secondary | ICD-10-CM | POA: Diagnosis present

## 2023-04-25 LAB — I-STAT VENOUS BLOOD GAS, ED
Acid-Base Excess: 4 mmol/L — ABNORMAL HIGH (ref 0.0–2.0)
Bicarbonate: 31 mmol/L — ABNORMAL HIGH (ref 20.0–28.0)
Calcium, Ion: 1.16 mmol/L (ref 1.15–1.40)
HCT: 44 % (ref 36.0–46.0)
Hemoglobin: 15 g/dL (ref 12.0–15.0)
O2 Saturation: 65 %
Potassium: 4 mmol/L (ref 3.5–5.1)
Sodium: 141 mmol/L (ref 135–145)
TCO2: 33 mmol/L — ABNORMAL HIGH (ref 22–32)
pCO2, Ven: 52.7 mmHg (ref 44–60)
pH, Ven: 7.378 (ref 7.25–7.43)
pO2, Ven: 35 mmHg (ref 32–45)

## 2023-04-25 LAB — BASIC METABOLIC PANEL
Anion gap: 9 (ref 5–15)
BUN: 6 mg/dL (ref 6–20)
CO2: 28 mmol/L (ref 22–32)
Calcium: 8.8 mg/dL — ABNORMAL LOW (ref 8.9–10.3)
Chloride: 102 mmol/L (ref 98–111)
Creatinine, Ser: 0.99 mg/dL (ref 0.44–1.00)
GFR, Estimated: >60 mL/min (ref >60)
Glucose, Bld: 166 mg/dL — ABNORMAL HIGH (ref 70–99)
Potassium: 2.9 mmol/L — ABNORMAL LOW (ref 3.5–5.1)
Sodium: 139 mmol/L (ref 135–145)

## 2023-04-25 LAB — I-STAT CHEM 8, ED
BUN: 6 mg/dL (ref 6–20)
Calcium, Ion: 1.15 mmol/L (ref 1.15–1.40)
Chloride: 102 mmol/L (ref 98–111)
Creatinine, Ser: 0.9 mg/dL (ref 0.44–1.00)
Glucose, Bld: 146 mg/dL — ABNORMAL HIGH (ref 70–99)
HCT: 45 % (ref 36.0–46.0)
Hemoglobin: 15.3 g/dL — ABNORMAL HIGH (ref 12.0–15.0)
Potassium: 4 mmol/L (ref 3.5–5.1)
Sodium: 141 mmol/L (ref 135–145)
TCO2: 29 mmol/L (ref 22–32)

## 2023-04-25 LAB — CBC WITH DIFFERENTIAL/PLATELET
Abs Immature Granulocytes: 0.06 10*3/uL (ref 0.00–0.07)
Basophils Absolute: 0.1 10*3/uL (ref 0.0–0.1)
Basophils Relative: 1 %
Eosinophils Absolute: 2 10*3/uL — ABNORMAL HIGH (ref 0.0–0.5)
Eosinophils Relative: 11 %
HCT: 44.8 % (ref 36.0–46.0)
Hemoglobin: 15 g/dL (ref 12.0–15.0)
Immature Granulocytes: 0 %
Lymphocytes Relative: 11 %
Lymphs Abs: 1.9 10*3/uL (ref 0.7–4.0)
MCH: 31.4 pg (ref 26.0–34.0)
MCHC: 33.5 g/dL (ref 30.0–36.0)
MCV: 93.7 fL (ref 80.0–100.0)
Monocytes Absolute: 0.6 10*3/uL (ref 0.1–1.0)
Monocytes Relative: 3 %
Neutro Abs: 13.4 10*3/uL — ABNORMAL HIGH (ref 1.7–7.7)
Neutrophils Relative %: 74 %
Platelets: 342 10*3/uL (ref 150–400)
RBC: 4.78 MIL/uL (ref 3.87–5.11)
RDW: 12 % (ref 11.5–15.5)
WBC: 18.1 10*3/uL — ABNORMAL HIGH (ref 4.0–10.5)
nRBC: 0 % (ref 0.0–0.2)

## 2023-04-25 LAB — TROPONIN I (HIGH SENSITIVITY)
Troponin I (High Sensitivity): 5 ng/L (ref <18)
Troponin I (High Sensitivity): 3 ng/L (ref <18)

## 2023-04-25 LAB — MAGNESIUM: Magnesium: 1.7 mg/dL (ref 1.7–2.4)

## 2023-04-25 LAB — RESP PANEL BY RT-PCR (RSV, FLU A&B, COVID)  RVPGX2
Influenza A by PCR: NEGATIVE
Influenza B by PCR: NEGATIVE
Resp Syncytial Virus by PCR: NEGATIVE
SARS Coronavirus 2 by RT PCR: NEGATIVE

## 2023-04-25 LAB — BRAIN NATRIURETIC PEPTIDE: B Natriuretic Peptide: 31.2 pg/mL (ref 0.0–100.0)

## 2023-04-25 LAB — HCG, SERUM, QUALITATIVE: Preg, Serum: NEGATIVE

## 2023-04-25 MED ORDER — ALBUTEROL SULFATE (5 MG/ML) 0.5% IN NEBU: 2.5000 mg | INHALATION_SOLUTION | Freq: Four times a day (QID) | RESPIRATORY_TRACT | 12 refills | Status: AC | PRN

## 2023-04-25 MED ORDER — POLYETHYLENE GLYCOL 3350 17 G PO PACK: 17.0000 g | PACK | Freq: Every day | ORAL | Status: DC | PRN

## 2023-04-25 MED ORDER — ALBUTEROL SULFATE (2.5 MG/3ML) 0.083% IN NEBU: 10.0000 mg | INHALATION_SOLUTION | RESPIRATORY_TRACT | Status: DC | PRN

## 2023-04-25 MED ORDER — SODIUM CHLORIDE 0.9 % IV SOLN
1.0000 g | Freq: Once | INTRAVENOUS | Status: AC
  Administered 2023-04-25: 1 g via INTRAVENOUS
  Filled 2023-04-25: qty 10

## 2023-04-25 MED ORDER — ACETAMINOPHEN 325 MG PO TABS: 650.0000 mg | ORAL_TABLET | Freq: Four times a day (QID) | ORAL | Status: DC | PRN

## 2023-04-25 MED ORDER — POTASSIUM CHLORIDE CRYS ER 20 MEQ PO TBCR
40.0000 meq | EXTENDED_RELEASE_TABLET | Freq: Once | ORAL | Status: AC
  Administered 2023-04-25: 40 meq via ORAL
  Filled 2023-04-25: qty 2

## 2023-04-25 MED ORDER — AZITHROMYCIN 250 MG PO TABS: 250.0000 mg | ORAL_TABLET | Freq: Every day | ORAL | 0 refills | Status: DC

## 2023-04-25 MED ORDER — SODIUM CHLORIDE 0.9 % IV BOLUS
1000.0000 mL | Freq: Once | INTRAVENOUS | Status: AC
  Administered 2023-04-25: 1000 mL via INTRAVENOUS

## 2023-04-25 MED ORDER — ALBUTEROL SULFATE HFA 108 (90 BASE) MCG/ACT IN AERS
2.0000 | INHALATION_SPRAY | Freq: Once | RESPIRATORY_TRACT | Status: AC
  Administered 2023-04-25: 2 via RESPIRATORY_TRACT
  Filled 2023-04-25: qty 6.7

## 2023-04-25 MED ORDER — ONDANSETRON HCL 4 MG/2ML IJ SOLN
4.0000 mg | Freq: Once | INTRAMUSCULAR | Status: AC
  Administered 2023-04-25: 4 mg via INTRAVENOUS
  Filled 2023-04-25: qty 2

## 2023-04-25 MED ORDER — IPRATROPIUM BROMIDE 0.02 % IN SOLN: 0.5000 mg | Freq: Four times a day (QID) | RESPIRATORY_TRACT | Status: DC

## 2023-04-25 MED ORDER — LACTATED RINGERS IV BOLUS: 1000.0000 mL | Freq: Once | INTRAVENOUS | Status: DC

## 2023-04-25 MED ORDER — ENOXAPARIN SODIUM 40 MG/0.4ML IJ SOSY: 40.0000 mg | PREFILLED_SYRINGE | INTRAMUSCULAR | Status: DC

## 2023-04-25 MED ORDER — SODIUM CHLORIDE 0.9% FLUSH: 3.0000 mL | Freq: Two times a day (BID) | INTRAVENOUS | Status: DC

## 2023-04-25 MED ORDER — SODIUM CHLORIDE 0.9 % IV SOLN
500.0000 mg | Freq: Once | INTRAVENOUS | Status: AC
  Administered 2023-04-25: 500 mg via INTRAVENOUS
  Filled 2023-04-25: qty 5

## 2023-04-25 MED ORDER — ALBUTEROL SULFATE (2.5 MG/3ML) 0.083% IN NEBU
10.0000 mg | INHALATION_SOLUTION | Freq: Once | RESPIRATORY_TRACT | Status: AC
  Administered 2023-04-25: 10 mg via RESPIRATORY_TRACT
  Filled 2023-04-25: qty 12

## 2023-04-25 MED ORDER — METHYLPREDNISOLONE SODIUM SUCC 125 MG IJ SOLR
125.0000 mg | Freq: Once | INTRAMUSCULAR | Status: AC
  Administered 2023-04-25: 125 mg via INTRAVENOUS
  Filled 2023-04-25: qty 2

## 2023-04-25 MED ORDER — CEFPODOXIME PROXETIL 200 MG PO TABS: 200.0000 mg | ORAL_TABLET | Freq: Two times a day (BID) | ORAL | 0 refills | Status: AC

## 2023-04-25 MED ORDER — PREDNISONE 5 MG PO TABS: 50.0000 mg | ORAL_TABLET | Freq: Every day | ORAL | Status: DC

## 2023-04-25 MED ORDER — ACETAMINOPHEN 650 MG RE SUPP: 650.0000 mg | Freq: Four times a day (QID) | RECTAL | Status: DC | PRN

## 2023-04-25 MED ORDER — PREDNISONE 10 MG PO TABS: 40.0000 mg | ORAL_TABLET | Freq: Every day | ORAL | 0 refills | Status: AC

## 2023-04-25 MED ORDER — IOHEXOL 350 MG/ML SOLN
75.0000 mL | Freq: Once | INTRAVENOUS | Status: AC | PRN
  Administered 2023-04-25: 75 mL via INTRAVENOUS

## 2023-04-25 NOTE — ED Notes (Signed)
Pt independently ambulatory in hallway with standby assist with steady gait. Patient SpO2 dropped to 87% and HR increased to 142bpm. Pt still requesting to leave; ED attending physician, Dr. Durwin Nora, made aware via face-to-face conversation and at bedside to discuss risks of leaving AMA with patient. Patient states that she would like to finish her IV antibiotics and then go home.

## 2023-04-25 NOTE — Discharge Instructions (Signed)
Currently, you have severe illness which may result in disability and/or death.  Return to the emergency department at any time if you change your mind about admission for in-hospital care.  If not, continue steroids and antibiotics at home.  Use breathing treatments as needed.  Prescriptions for these medications were sent to your pharmacy.

## 2023-04-25 NOTE — ED Triage Notes (Addendum)
PT arrives via EMS from home. Pt c/o Sob, cough and chest tightness for 3 days. Upon EMS arrival, patient's o2 saturation was in the mid 80s and she could only speak about 2-3 words at at time. EMS administered albuterol and atrovent with some improvement. Patient's room air saturation during triage is 88%. Pt placed on 2Lnc. She is AxOx4. She does report that her children have tested positive for covid

## 2023-04-25 NOTE — ED Notes (Signed)
Patient reports having episode of coughing and feeling much better; SpO2 99% on 4L decreased to 2L and maintained SpO2 98%. Patient trialing room air at this time and SpO2 98%. Patient states that she would like to go home if possible; ED attending physician, Dr. Alinda Money, made aware via securechat.

## 2023-04-25 NOTE — ED Provider Notes (Addendum)
[place] EMERGENCY DEPARTMENT AT [facility] Provider Note   CSN: [id number] Arrival date & time: 04/25/23  1110     History  Chief Complaint  Patient presents with   Shortness of Breath   Cough   Chest Pain    Carrie Peterson is a 27 y.o. female.  HPI Patient presents for shortness of breath.  Medical history includes anxiety, depression, polysubstance abuse.  She was never formally diagnosed with asthma or COPD.  She states that she was hospitalized 1 year ago for breathing difficulty.  Since that time, she has had a nebulizer at home.  She has ran out of her medications.  Over the last 3 days, she has had worsening shortness of breath, chest tightness, and wheezing.  Due to the worsening symptoms, EMS was called to her home today.  When they arrived on scene, she had tachypnea, increased work of breathing, accessory muscle use, and hypoxia with SpO2 in the mid to high 80s on room air.  Patient was placed on nonrebreather.  She arrives near completion of DuoNeb breathing treatment.  She does report improved symptoms with DuoNeb.  She is a smoker.    Home Medications Prior to Admission medications   Medication Sig Start Date End Date Taking? Authorizing Provider  albuterol (PROVENTIL) (2.5 MG/3ML) 0.083% nebulizer solution Take 3 mLs (2.5 mg total) by nebulization every 6 (six) hours as needed for wheezing or shortness of breath. Patient not taking: Reported on 02/17/2023 09/23/20   Concha Se, MD  dicyclomine (BENTYL) 20 MG tablet Take 1 tablet (20 mg total) by mouth 2 (two) times daily. 03/17/23   Sabas Sous, MD  loperamide (IMODIUM) 2 MG capsule Take 1 capsule (2 mg total) by mouth 4 (four) times daily as needed for diarrhea or loose stools. 03/17/23   Sabas Sous, MD  ondansetron (ZOFRAN) 4 MG tablet Take 1 tablet (4 mg total) by mouth every 8 (eight) hours as needed for nausea or vomiting. Patient not taking: Reported on 02/17/2023 12/15/22   Lowther, Amy, DO   ondansetron (ZOFRAN-ODT) 4 MG disintegrating tablet Take 1 tablet (4 mg total) by mouth every 8 (eight) hours as needed for nausea or vomiting. 03/17/23   Sabas Sous, MD  Prenatal Vit-Fe Fumarate-FA (PRENATAL VITAMIN PLUS LOW IRON) 27-1 MG TABS Please specify directions, refills and quantity Patient not taking: Reported on 02/17/2023 06/03/18   Aviva Signs, CNM  sucralfate (CARAFATE) 1 g tablet Take 1 tablet (1 g total) by mouth 4 (four) times daily as needed. 03/17/23   Sabas Sous, MD      Allergies    Patient has no known allergies.    Review of Systems   Review of Systems  Respiratory:  Positive for chest tightness, shortness of breath and wheezing.   All other systems reviewed and are negative.   Physical Exam Updated Vital Signs BP 127/66 (BP Location: Right Arm)   Pulse (!) 103   Temp (!) 97.5 F (36.4 C)   Resp (!) 24   Ht 5\' 6"  (1.676 m)   Wt 81.6 kg   SpO2 92%   BMI 29.05 kg/m  Physical Exam Vitals and nursing note reviewed.  Constitutional:      General: She is not in acute distress.    Appearance: Normal appearance. She is well-developed. She is not toxic-appearing or diaphoretic.  HENT:     Head: Normocephalic and atraumatic.     Right Ear: External ear normal.  Left Ear: External ear normal.     Nose: Nose normal.     Mouth/Throat:     Mouth: Mucous membranes are moist.  Eyes:     Extraocular Movements: Extraocular movements intact.     Conjunctiva/sclera: Conjunctivae normal.  Cardiovascular:     Rate and Rhythm: Regular rhythm. Tachycardia present.     Heart sounds: No murmur heard. Pulmonary:     Effort: Pulmonary effort is normal. No respiratory distress.     Breath sounds: Wheezing and rhonchi present.  Chest:     Chest wall: No tenderness.  Abdominal:     General: There is no distension.     Palpations: Abdomen is soft.     Tenderness: There is no abdominal tenderness.  Musculoskeletal:        General: No swelling. Normal range  of motion.     Cervical back: Normal range of motion and neck supple.     Right lower leg: No edema.     Left lower leg: No edema.  Skin:    General: Skin is warm and dry.     Coloration: Skin is not jaundiced or pale.  Neurological:     General: No focal deficit present.     Mental Status: She is alert and oriented to person, place, and time.  Psychiatric:        Mood and Affect: Mood normal.        Behavior: Behavior normal.     ED Results / Procedures / Treatments   Labs (all labs ordered are listed, but only abnormal results are displayed) Labs Reviewed  CBC WITH DIFFERENTIAL/PLATELET - Abnormal; Notable for the following components:      Result Value   WBC 18.1 (*)    Neutro Abs 13.4 (*)    Eosinophils Absolute 2.0 (*)    All other components within normal limits  I-STAT CHEM 8, ED - Abnormal; Notable for the following components:   Glucose, Bld 146 (*)    Hemoglobin 15.3 (*)    All other components within normal limits  I-STAT VENOUS BLOOD GAS, ED - Abnormal; Notable for the following components:   Bicarbonate 31.0 (*)    TCO2 33 (*)    Acid-Base Excess 4.0 (*)    All other components within normal limits  RESP PANEL BY RT-PCR (RSV, FLU A&B, COVID)  RVPGX2  HCG, SERUM, QUALITATIVE  BRAIN NATRIURETIC PEPTIDE  BASIC METABOLIC PANEL  MAGNESIUM  TROPONIN I (HIGH SENSITIVITY)  TROPONIN I (HIGH SENSITIVITY)    EKG EKG Interpretation Date/Time:  Sunday April 25 2023 11:20:43 EDT Ventricular Rate:  76 PR Interval:  120 QRS Duration:  82 QT Interval:  389 QTC Calculation: 438 R Axis:   62  Text Interpretation: Normal sinus rhythm Confirmed by Gloris Manchester ([id number]) on 04/25/2023 12:40:52 PM  Radiology CT Angio Chest PE W and/or Wo Contrast  Result Date: 04/25/2023 CLINICAL DATA:  Short of breath and chest tightness for 3 days. Low oxygen saturation. EXAM: CT ANGIOGRAPHY CHEST WITH CONTRAST TECHNIQUE: Multidetector CT imaging of the chest was performed using the  standard protocol during bolus administration of intravenous contrast. Multiplanar CT image reconstructions and MIPs were obtained to evaluate the vascular anatomy. RADIATION DOSE REDUCTION: This exam was performed according to the departmental dose-optimization program which includes automated exposure control, adjustment of the mA and/or kV according to patient size and/or use of iterative reconstruction technique. CONTRAST:  75mL OMNIPAQUE IOHEXOL 350 MG/ML SOLN COMPARISON:  Current chest radiograph.  Chest CT,  03/17/2023. FINDINGS: Cardiovascular: Pulmonary arteries are relatively well opacified. There is no evidence of a pulmonary embolism. Heart is normal in size and configuration. No pericardial effusion. Normal great vessels. Mediastinum/Nodes: Normal visualized thyroid. No neck base masses or enlarged lymph nodes. Large subcarinal lymph node measuring 1.6 cm in short axis. Bilateral prominent to mildly enlarged hilar lymph nodes measuring up to 1.1 cm in short axis. Trachea and esophagus are unremarkable. Lungs/Pleura: Bilateral bronchial wall thickening with areas of mucous plugging. Peribronchovascular ground-glass opacities also noted bilaterally, greater on the right. No pleural effusion or pneumothorax. Upper Abdomen: Unremarkable. Musculoskeletal: Normal. Review of the MIP images confirms the above findings. IMPRESSION: 1. No evidence of a pulmonary embolism. 2. Bilateral bronchial wall thickening, mucous plugging and areas of ground-glass opacity, the latter greater on the right. Findings consistent with multifocal infection. Atypical etiologies should be considered including viral. Electronically Signed   By: Amie Portland M.D.   On: 04/25/2023 13:14   DG Chest Port 1 View  Result Date: 04/25/2023 CLINICAL DATA:  Dyspnea EXAM: PORTABLE CHEST 1 VIEW COMPARISON:  CTA chest 03/17/2023 FINDINGS: The cardiomediastinal silhouette is normal There is no focal consolidation or pulmonary edema. There is  no pleural effusion or pneumothorax There is no acute osseous abnormality. IMPRESSION: No radiographic evidence of acute cardiopulmonary process. Electronically Signed   By: Lesia Hausen M.D.   On: 04/25/2023 11:33    Procedures Procedures    Medications Ordered in ED Medications  cefTRIAXone (ROCEPHIN) 1 g in sodium chloride 0.9 % 100 mL IVPB (has no administration in time range)  azithromycin (ZITHROMAX) 500 mg in sodium chloride 0.9 % 250 mL IVPB (has no administration in time range)  sodium chloride 0.9 % bolus 1,000 mL (has no administration in time range)  albuterol (PROVENTIL) (2.5 MG/3ML) 0.083% nebulizer solution 10 mg (10 mg Nebulization Given 04/25/23 1135)  methylPREDNISolone sodium succinate (SOLU-MEDROL) 125 mg/2 mL injection 125 mg (125 mg Intravenous Given 04/25/23 1138)  iohexol (OMNIPAQUE) 350 MG/ML injection 75 mL (75 mLs Intravenous Contrast Given 04/25/23 1253)    ED Course/ Medical Decision Making/ A&P                                 Medical Decision Making Amount and/or Complexity of Data Reviewed Labs: ordered. Radiology: ordered.  Risk Prescription drug management. Decision regarding hospitalization.   This patient presents to the ED for concern of shortness of breath, this involves an extensive number of treatment options, and is a complaint that carries with it a high risk of complications and morbidity.  The differential diagnosis includes reactive airway disease exacerbation, PE, pneumonia, pulmonary edema   Co morbidities that complicate the patient evaluation  Anxiety, depression   Additional history obtained:  Additional history obtained from EMS External records from outside source obtained and reviewed including EMR   Lab Tests:  I Ordered, and personally interpreted labs.  The pertinent results include: Leukocytosis is present.  Hemoglobin is normal.  Troponin and BNP are normal.  There is slight hypercarbia on blood gas.   Imaging  Studies ordered:  I ordered imaging studies including chest x-ray, CTA chest I independently visualized and interpreted imaging which showed multifocal pneumonia I agree with the radiologist interpretation   Cardiac Monitoring: / EKG:  The patient was maintained on a cardiac monitor.  I personally viewed and interpreted the cardiac monitored which showed an underlying rhythm of: Sinus rhythm  Problem  List / ED Course / Critical interventions / Medication management  Patient presenting for worsening shortness of breath over the past 3 days.  EMS reports wheezing, tachypnea, and accessory muscle use on scene.  Her symptoms have improved after DuoNeb prior to arrival.  On arrival, she continued to have wheezing and rhonchi on lung auscultation.  She is able to speak in complete sentences.  Continuous albuterol and Solu-Medrol were ordered.  Workup was initiated.  Lab work is notable for leukocytosis.  CTA of chest showed findings consistent with multifocal pneumonia.  Antibiotics were initiated.  Following nebulizer treatment, patient remained hypoxic on room air.  She was placed on 2 L of supplemental oxygen.  Work of breathing remains improved.  Given her hypoxic respiratory failure hospitalist was contacted for admission.  While in the ED, patient stated that she would like to go home.  She states that she has childcare responsibilities.  She was taken off of supplemental oxygen and SpO2 remained normal on room air at rest.  On trial of walking, patient's tachycardia worsened to the 140s.  She became hypoxic to the high 80s.  She had mildly increased work of breathing.  Patient was encouraged to stay for admission.  She refused this.  Patient to sign out AMA.  She was provided with prescriptions for antibiotics, breathing treatments, and steroids.  She was encouraged to return. I ordered medication including albuterol and Solu-Medrol for reactive airway disease; IV fluids and antibiotics for  pneumonia Reevaluation of the patient after these medicines showed that the patient improved I have reviewed the patients home medicines and have made adjustments as needed   Social Determinants of Health:  Lives independently  CRITICAL CARE Performed by: Gloris Manchester   Total critical care time: 32 minutes  Critical care time was exclusive of separately billable procedures and treating other patients.  Critical care was necessary to treat or prevent imminent or life-threatening deterioration.  Critical care was time spent personally by me on the following activities: development of treatment plan with patient and/or surrogate as well as nursing, discussions with consultants, evaluation of patient's response to treatment, examination of patient, obtaining history from patient or surrogate, ordering and performing treatments and interventions, ordering and review of laboratory studies, ordering and review of radiographic studies, pulse oximetry and re-evaluation of patient's condition.       Final Clinical Impression(s) / ED Diagnoses Final diagnoses:  Multifocal pneumonia  Acute respiratory failure with hypoxia [facility])    Rx / DC Orders ED Discharge Orders     None         Gloris Manchester, MD 04/25/23 1333    Gloris Manchester, MD 04/25/23 1333    Gloris Manchester, MD 04/25/23 2136

## 2023-04-25 NOTE — ED Notes (Signed)
Pt stated she was becoming a bit anxious. Explained to her that this was due to the way she was breathing. Attempted to encourage patient to stay and she continues to want to leave.

## 2024-01-21 ENCOUNTER — Other Ambulatory Visit: Payer: Self-pay

## 2024-01-21 ENCOUNTER — Encounter (HOSPITAL_BASED_OUTPATIENT_CLINIC_OR_DEPARTMENT_OTHER): Payer: Self-pay | Admitting: Orthopaedic Surgery

## 2024-01-24 NOTE — H&P (Signed)
 PREOPERATIVE H&P  Chief Complaint: Closed dislocation of right patella, Chondromalacia of right patella  HPI: Carrie Peterson is a 28 y.o. female who is scheduled for, Procedure(s): RECONSTRUCTION, LIGAMENT, MEDIAL PATELLOFEMORAL.   Patient has had multiple events of patellofemoral instability. She has locking and is dislocating at least 3 times a week. She is unhappy with the function of her right knee.   Symptoms are rated as moderate to severe, and have been worsening.  This is significantly impairing activities of daily living.    Please see clinic note for further details on this patient's care.    She has elected for surgical management.   Past Medical History:  Diagnosis Date   Anxiety    Cervical cancer screening 12/23/2016   Overview:   01/2016 per patient PAP WNL, HR HPV negative     Last Assessment & Plan:   01/2016 per patient PAP WNL, HR HPV negative     Depression    Fracture 02/13/2014   Back, 5 years ago.   Nondisplaced fracture of base of fifth metacarpal bone, right hand, initial encounter for closed fracture 12/23/2016   Overview:   Short term disability receipt #[id number]     Last Assessment & Plan:   Being seen by orthopedics  Currently not working b/c of this  Patient filed for short term disability     PTSD (post-traumatic stress disorder)    Spontaneous vaginal delivery 03/07/2019   Ulcer    Past Surgical History:  Procedure Laterality Date   addenoidectomy     TONSILLECTOMY AND ADENOIDECTOMY     Social History   Socioeconomic History   Marital status: Single    Spouse name: Not on file   Number of children: Not on file   Years of education: Not on file   Highest education level: Not on file  Occupational History   Occupation: part time  Tobacco Use   Smoking status: Every Day    Current packs/day: 0.50    Types: Cigarettes   Smokeless tobacco: Never  Vaping Use   Vaping status: Former   Quit date: 05/29/2017  Substance and  Sexual Activity   Alcohol use: No   Drug use: No   Sexual activity: Yes  Other Topics Concern   Not on file  Social History Narrative   Not on file   Social Drivers of Health   Financial Resource Strain: Not on File (04/04/2018)   Received from [employer]    Financial Resource Strain: 0  Food Insecurity: Not on File (04/04/2018)   Received from [employer] Insecurity    Food: 0  Transportation Needs: Not on File (04/04/2018)   Received from [employer] Needs    Transportation: 0  Physical Activity: Not on File (04/04/2018)   Received [facility]   Physical Activity    Physical Activity: 0  Stress: Not on File (04/04/2018)   Received from [facility]   Stress    Stress: 0  Social Connections: Not on File (04/04/2018)   Received from [facility]   Social Connections    Social Connections and Isolation: 0   Family History  Problem Relation Age of Onset   Alcohol abuse Father    Anxiety disorder Father    Bipolar disorder Father    Depression Father    Drug abuse Father    Diabetes Father    Hypertension Father    Depression Mother    Multiple sclerosis Mother  COPD Maternal Uncle    Cancer Maternal Grandmother    COPD Maternal Grandmother    Arthritis Neg Hx    Asthma Neg Hx    Birth defects Neg Hx    Early death Neg Hx    Hearing loss Neg Hx    Heart disease Neg Hx    Kidney disease Neg Hx    Hyperlipidemia Neg Hx    Learning disabilities Neg Hx    Mental illness Neg Hx    Mental retardation Neg Hx    Miscarriages / Stillbirths Neg Hx    Stroke Neg Hx    Vision loss Neg Hx    No Known Allergies Prior to Admission medications   Medication Sig Start Date End Date Taking? Authorizing Provider  etonogestrel  (NEXPLANON ) 68 MG IMPL implant 1 each by Subdermal route once.   Yes [provider]  methadone (DOLOPHINE) 10 MG/5ML solution Take 140 mg by mouth daily.   Yes [provider]  albuterol  (PROVENTIL ) (5 MG/ML) 0.5%  nebulizer solution Take 0.5 mLs (2.5 mg total) by nebulization every 6 (six) hours as needed for wheezing or shortness of breath. 04/25/23   Iva Mariner, MD    ROS: All other systems have been reviewed and were otherwise negative with the exception of those mentioned in the HPI and as above.  Physical Exam: General: Alert, no acute distress Cardiovascular: No pedal edema Respiratory: No cyanosis, no use of accessory musculature GI: No organomegaly, abdomen is soft and non-tender Skin: No lesions in the area of chief complaint Neurologic: Sensation intact distally Psychiatric: Patient is competent for consent with normal mood and affect Lymphatic: No axillary or cervical lymphadenopathy  MUSCULOSKELETAL:  Right knee: ROM limited to 40 degrees flexion. Apprehension with lateral translation of the patella  Imaging: Xrays demonstrate no bony abnormalities  Assessment: Closed dislocation of right patella, Chondromalacia of right patella  Plan: Plan for Procedure(s): RECONSTRUCTION, LIGAMENT, MEDIAL PATELLOFEMORAL  The risks benefits and alternatives were discussed with the patient including but not limited to the risks of nonoperative treatment, versus surgical intervention including infection, bleeding, nerve injury,  blood clots, cardiopulmonary complications, morbidity, mortality, among others, and they were willing to proceed.   The patient acknowledged the explanation, agreed to proceed with the plan and consent was signed.   Operative Plan: Right knee scope with MPFL reconstruction Discharge Medications: standard DVT Prophylaxis: aspirin Physical Therapy: outpatient PT Special Discharge needs: [place] (order). [employer]   Adine Ahmadi, PA-C  01/24/2024 9:33 AM

## 2024-01-26 NOTE — Anesthesia Preprocedure Evaluation (Addendum)
 Anesthesia Evaluation  Patient identified by MRN, date of birth, ID band Patient awake    Reviewed: Allergy & Precautions, NPO status , Patient's Chart, lab work & pertinent test results  Airway Mallampati: II  TM Distance: >3 FB Neck ROM: Full    Dental no notable dental hx. (+) Teeth Intact, Dental Advisory Given   Pulmonary Current Smoker and Patient abstained from smoking.   Pulmonary exam normal breath sounds clear to auscultation       Cardiovascular negative cardio ROS Normal cardiovascular exam Rhythm:Regular Rate:Normal     Neuro/Psych   Anxiety Depression       GI/Hepatic negative GI ROS, Neg liver ROS,,,  Endo/Other    Renal/GU      Musculoskeletal   Abdominal   Peds  Hematology   Anesthesia Other Findings NKDA  Reproductive/Obstetrics                             Anesthesia Physical Anesthesia Plan  ASA: 2  Anesthesia Plan: General and Regional   Post-op Pain Management: Precedex, Tylenol  PO (pre-op)*, Toradol  IV (intra-op)*, Regional block* and Minimal or no pain anticipated   Induction: Intravenous  PONV Risk Score and Plan: 3 and Ondansetron , Midazolam and Treatment may vary due to age or medical condition  Airway Management Planned: LMA  Additional Equipment: None  Intra-op Plan:   Post-operative Plan: Extubation in OR  Informed Consent: I have reviewed the patients History and Physical, chart, labs and discussed the procedure including the risks, benefits and alternatives for the proposed anesthesia with the patient or authorized representative who has indicated his/her understanding and acceptance.     Dental advisory given  Plan Discussed with: CRNA and Surgeon  Anesthesia Plan Comments: (  Lma + RFNB)       Anesthesia Quick Evaluation

## 2024-01-26 NOTE — Discharge Instructions (Signed)
 Grafton Lawrence MD, MPH Nicholas Bari, PA-C [facility] [address], [address] [phone] (tel)   [phone] (fax)   POST-OPERATIVE INSTRUCTIONS - MPFL RECONSTRUCTION  WOUND CARE You may remove the Operative Dressing on Post-Op Day #3 (72hrs after surgery).   Leave steri strips in place.   If you feel more comfortable with it you can leave all dressings in place till your 1 week follow-up with me.   KEEP THE INCISIONS CLEAN AND DRY. An ACE wrap may be used to control swelling, do not wrap this too tight.  If the initial ACE wrap feels too tight or constricting you may loosen it. There may be a small amount of fluid/bleeding leaking at the surgical site.  This is normal; the knee is filled with fluid during the procedure and can leak for 24-48hrs after surgery. You may change/reinforce the bandage as needed.  Use the Cryocuff, GameReady or Ice as often as possible for the first 3-4 days, then as needed for pain relief. Always keep a towel, ACE wrap or other barrier between the cooling unit and your skin.  You may shower on Post-Op Day #3. Gently pat the area dry.  Do not soak the knee in water.  Do not go swimming in the pool or ocean until 4 weeks after surgery or when otherwise instructed.  BRACE/AMBULATION Your leg will be placed in a brace post-operatively.  You may remove for hygiene only! You will need to wear your brace at all times until we discuss it further.  It should be locked in full extension (0 degrees) if adjustable.   You will be instructed on further bracing after your first visit. Use crutches for comfort but you can put your full weight on the leg as tolerated.  PHYSICAL THERAPY - You will begin physical therapy soon after surgery (unless otherwise specified) - Please call to set up an appointment, if you do not already have one   - A PT referral was sent to [facility]  REGIONAL ANESTHESIA (NERVE BLOCKS) The anesthesia team may  have performed a nerve block for you this is a great tool used to minimize pain.   The block may start wearing off overnight (between 8-24 hours postop) When the block wears off, your pain may go from nearly zero to the pain you would have had postop without the block. This is an abrupt transition but nothing dangerous is happening.   This can be a challenging period but utilize your as needed pain medications to try and manage this period. We suggest you use the pain medication the first night prior to going to bed, to ease this transition.  You may take an extra dose of narcotic when this happens if needed  POST-OP MEDICATIONS- Multimodal approach to pain control In general your pain will be controlled with a combination of substances.  Prescriptions unless otherwise discussed are electronically sent to your pharmacy.  This is a carefully made plan we use to minimize narcotic use.     Toradol  - this is a strong anti-inflammatory take on a scheduled basis for three days ONLY Do not take with Ibuprofen  You will start taking Ibuprofen  once you have finished your Toradol  prescription Ibuprofen  - Anti-inflammatory medication taken on a scheduled basis Acetaminophen  - Non-narcotic pain medicine taken on a scheduled basis  Gabapentin - this is to help with nerve based pain, take on a scheduled basis Baclofen - this is a muscle relaxer, take as needed for  muscle spasms Aspirin 81mg  - This medicine is used to minimize the risk of blood clots after surgery. Zofran  - take as needed for nausea   FOLLOW-UP Please call the office to schedule a follow-up appointment for your incision check if you do not already have one, 7-10 days post-operatively. IF YOU HAVE ANY QUESTIONS, PLEASE FEEL FREE TO CALL OUR OFFICE.  HELPFUL INFORMATION  Keep your leg elevated to decrease swelling, which will then in turn decrease your pain. I would elevate the foot of your bed by putting a couple of couch pillows between  your mattress and box spring. I would not keep pillow directly under your ankle.  You must wear the brace locked while sleeping and ambulating until follow-up.   There will be MORE swelling on days 1-3 than there is on the day of surgery.  This also is normal. The swelling will decrease with the anti-inflammatory medication, ice and keeping it elevated. The swelling will make it more difficult to bend your knee. As the swelling goes down your motion will become easier  You may develop swelling and bruising that extends from your knee down to your calf and perhaps even to your foot over the next week. Do not be alarmed. This too is normal, and it is due to gravity  There may be some numbness adjacent to the incision site. This may last for 6-12 months or longer in some patients and is expected.  You may return to sedentary work/school in the next couple of days when you feel up to it. You will need to keep your leg elevated as much as possible   You should wean off your narcotic medicines as soon as you are able.  Most patients will be off narcotics before their first postop appointment.   We suggest you use the pain medication the first night prior to going to bed, in order to ease any pain when the anesthesia wears off. You should avoid taking pain medications on an empty stomach as it will make you nauseous.  Do not drink alcoholic beverages or take illicit drugs when taking pain medications.  It is against the law to drive while taking narcotics. You cannot drive if your Right leg is in brace locked in extension.  Pain medication may make you constipated.  Below are a few solutions to try in this order: Decrease the amount of pain medication if you aren't having pain. Drink lots of decaffeinated fluids. Drink prune juice and/or eat dried prunes  If the first 3 don't work start with additional solutions Take Colace - an over-the-counter stool softener Take Senokot - an over-the-counter  laxative Take Miralax  - a stronger over-the-counter laxative   For more information including helpful videos and documents visit our website:   [email or web address]-information.[email or web address]  No Tylenol  until 1:32 pm today, if needed.  No Ibuprofen /NSAIDS before 6pm tonight.  Regional Anesthesia Blocks  1. You may not be able to move or feel the blocked extremity after a regional anesthetic block. This may last may last from 3-48 hours after placement, but it will go away. The length of time depends on the medication injected and your individual response to the medication. As the nerves start to wake up, you may experience tingling as the movement and feeling returns to your extremity. If the numbness and inability to move your extremity has not gone away after 48 hours, please call your surgeon.   2. The extremity that is blocked will need to be protected until  the numbness is gone and the strength has returned. Because you cannot feel it, you will need to take extra care to avoid injury. Because it may be weak, you may have difficulty moving it or using it. You may not know what position it is in without looking at it while the block is in effect.  3. For blocks in the legs and feet, returning to weight bearing and walking needs to be done carefully. You will need to wait until the numbness is entirely gone and the strength has returned. You should be able to move your leg and foot normally before you try and bear weight or walk. You will need someone to be with you when you first try to ensure you do not fall and possibly risk injury.  4. Bruising and tenderness at the needle site are common side effects and will resolve in a few days.  5. Persistent numbness or new problems with movement should be communicated to the surgeon or the [facility] [phone] [facility] [phone]).

## 2024-01-27 ENCOUNTER — Encounter (HOSPITAL_BASED_OUTPATIENT_CLINIC_OR_DEPARTMENT_OTHER)
Admission: RE | Disposition: A | Discharge status: Home / Self Care | Payer: Self-pay | Source: Ambulatory Visit | Attending: Orthopaedic Surgery

## 2024-01-27 ENCOUNTER — Ambulatory Visit (HOSPITAL_BASED_OUTPATIENT_CLINIC_OR_DEPARTMENT_OTHER): Payer: MEDICAID | Admitting: Anesthesiology

## 2024-01-27 ENCOUNTER — Encounter (HOSPITAL_BASED_OUTPATIENT_CLINIC_OR_DEPARTMENT_OTHER): Payer: Self-pay | Admitting: Orthopaedic Surgery

## 2024-01-27 ENCOUNTER — Other Ambulatory Visit: Payer: Self-pay

## 2024-01-27 ENCOUNTER — Ambulatory Visit (HOSPITAL_BASED_OUTPATIENT_CLINIC_OR_DEPARTMENT_OTHER)
Admission: RE | Admit: 2024-01-27 | Discharge: 2024-01-27 | Disposition: A | Discharge status: Home / Self Care | Payer: MEDICAID | Source: Ambulatory Visit | Attending: Orthopaedic Surgery | Admitting: Orthopaedic Surgery

## 2024-01-27 ENCOUNTER — Ambulatory Visit (HOSPITAL_BASED_OUTPATIENT_CLINIC_OR_DEPARTMENT_OTHER): Payer: MEDICAID

## 2024-01-27 DIAGNOSIS — Z01818 Encounter for other preprocedural examination: Secondary | ICD-10-CM

## 2024-01-27 DIAGNOSIS — X58XXXA Exposure to other specified factors, initial encounter: Secondary | ICD-10-CM | POA: Insufficient documentation

## 2024-01-27 DIAGNOSIS — F17210 Nicotine dependence, cigarettes, uncomplicated: Secondary | ICD-10-CM | POA: Diagnosis not present

## 2024-01-27 DIAGNOSIS — M2241 Chondromalacia patellae, right knee: Secondary | ICD-10-CM | POA: Diagnosis present

## 2024-01-27 DIAGNOSIS — S83004A Unspecified dislocation of right patella, initial encounter: Secondary | ICD-10-CM | POA: Diagnosis present

## 2024-01-27 DIAGNOSIS — M25361 Other instability, right knee: Secondary | ICD-10-CM

## 2024-01-27 HISTORY — PX: MEDIAL PATELLOFEMORAL LIGAMENT REPAIR: SHX2020

## 2024-01-27 LAB — POCT PREGNANCY, URINE: Preg Test, Ur: NEGATIVE

## 2024-01-27 SURGERY — RECONSTRUCTION, LIGAMENT, MEDIAL PATELLOFEMORAL
Anesthesia: Regional | Site: Knee | Laterality: Right

## 2024-01-27 MED ORDER — LACTATED RINGERS IV SOLN: INTRAVENOUS | Status: DC | PRN

## 2024-01-27 MED ORDER — GABAPENTIN 300 MG PO CAPS: 300.0000 mg | ORAL_CAPSULE | Freq: Two times a day (BID) | ORAL | 0 refills | Status: AC

## 2024-01-27 MED ORDER — LIDOCAINE 2% (20 MG/ML) 5 ML SYRINGE
INTRAMUSCULAR | Status: AC
  Filled 2024-01-27: qty 5

## 2024-01-27 MED ORDER — ACETAMINOPHEN 500 MG PO TABS
1000.0000 mg | ORAL_TABLET | Freq: Once | ORAL | Status: AC
  Administered 2024-01-27: 1000 mg via ORAL

## 2024-01-27 MED ORDER — PHENYLEPHRINE HCL (PRESSORS) 10 MG/ML IV SOLN
INTRAVENOUS | Status: DC | PRN
  Administered 2024-01-27 (×2): 160 ug via INTRAVENOUS

## 2024-01-27 MED ORDER — DEXMEDETOMIDINE HCL IN NACL 80 MCG/20ML IV SOLN
INTRAVENOUS | Status: DC | PRN
  Administered 2024-01-27 (×2): 8 ug via INTRAVENOUS
  Administered 2024-01-27: 4 ug via INTRAVENOUS
  Administered 2024-01-27: 8 ug via INTRAVENOUS

## 2024-01-27 MED ORDER — KETOROLAC TROMETHAMINE 30 MG/ML IJ SOLN
30.0000 mg | Freq: Once | INTRAMUSCULAR | Status: AC | PRN
  Administered 2024-01-27: 30 mg via INTRAVENOUS

## 2024-01-27 MED ORDER — MIDAZOLAM HCL 2 MG/2ML IJ SOLN
2.0000 mg | Freq: Once | INTRAMUSCULAR | Status: AC
  Administered 2024-01-27: 2 mg via INTRAVENOUS

## 2024-01-27 MED ORDER — FENTANYL CITRATE (PF) 100 MCG/2ML IJ SOLN
50.0000 ug | Freq: Once | INTRAMUSCULAR | Status: AC
  Administered 2024-01-27: 50 ug via INTRAVENOUS

## 2024-01-27 MED ORDER — ROPIVACAINE HCL 5 MG/ML IJ SOLN
INTRAMUSCULAR | Status: DC | PRN
  Administered 2024-01-27: 30 mL via PERINEURAL

## 2024-01-27 MED ORDER — CEFAZOLIN SODIUM-DEXTROSE 2-4 GM/100ML-% IV SOLN: 2.0000 g | INTRAVENOUS | Status: DC

## 2024-01-27 MED ORDER — CEFAZOLIN SODIUM-DEXTROSE 2-3 GM-%(50ML) IV SOLR
INTRAVENOUS | Status: DC | PRN
  Administered 2024-01-27: 2 g via INTRAVENOUS

## 2024-01-27 MED ORDER — HYDROMORPHONE HCL 1 MG/ML IJ SOLN
INTRAMUSCULAR | Status: AC
  Filled 2024-01-27: qty 0.5

## 2024-01-27 MED ORDER — CEFAZOLIN SODIUM-DEXTROSE 2-4 GM/100ML-% IV SOLN
INTRAVENOUS | Status: AC
  Filled 2024-01-27: qty 100

## 2024-01-27 MED ORDER — IBUPROFEN 800 MG PO TABS: 800.0000 mg | ORAL_TABLET | Freq: Three times a day (TID) | ORAL | 0 refills | Status: AC | PRN

## 2024-01-27 MED ORDER — ONDANSETRON HCL 4 MG/2ML IJ SOLN
INTRAMUSCULAR | Status: AC
  Filled 2024-01-27: qty 2

## 2024-01-27 MED ORDER — KETOROLAC TROMETHAMINE 30 MG/ML IJ SOLN
INTRAMUSCULAR | Status: AC
Start: 2024-01-27 — End: 2024-01-27
  Filled 2024-01-27: qty 1

## 2024-01-27 MED ORDER — ONDANSETRON HCL 4 MG/2ML IJ SOLN: 4.0000 mg | Freq: Once | INTRAMUSCULAR | Status: DC | PRN

## 2024-01-27 MED ORDER — DEXAMETHASONE SODIUM PHOSPHATE 10 MG/ML IJ SOLN
INTRAMUSCULAR | Status: DC | PRN
  Administered 2024-01-27: 5 mg via INTRAVENOUS

## 2024-01-27 MED ORDER — HYDROMORPHONE HCL 1 MG/ML IJ SOLN
0.2500 mg | INTRAMUSCULAR | Status: DC | PRN
  Administered 2024-01-27 (×2): 0.25 mg via INTRAVENOUS

## 2024-01-27 MED ORDER — ASPIRIN 81 MG PO CHEW: 81.0000 mg | CHEWABLE_TABLET | Freq: Two times a day (BID) | ORAL | 0 refills | Status: AC

## 2024-01-27 MED ORDER — BACLOFEN 10 MG PO TABS
10.0000 mg | ORAL_TABLET | Freq: Four times a day (QID) | ORAL | 0 refills | Status: AC | PRN
Start: 2024-01-27 — End: ?

## 2024-01-27 MED ORDER — ACETAMINOPHEN 500 MG PO TABS: 1000.0000 mg | ORAL_TABLET | Freq: Three times a day (TID) | ORAL | 0 refills | Status: AC

## 2024-01-27 MED ORDER — PROPOFOL 10 MG/ML IV BOLUS
INTRAVENOUS | Status: DC | PRN
Start: 2024-01-27 — End: 2024-01-27
  Administered 2024-01-27: 200 mg via INTRAVENOUS
  Administered 2024-01-27: 100 mg via INTRAVENOUS

## 2024-01-27 MED ORDER — VANCOMYCIN HCL 1000 MG IV SOLR
INTRAVENOUS | Status: AC
  Filled 2024-01-27: qty 20

## 2024-01-27 MED ORDER — ACETAMINOPHEN 500 MG PO TABS: 1000.0000 mg | ORAL_TABLET | Freq: Once | ORAL | Status: AC

## 2024-01-27 MED ORDER — CLONIDINE HCL (ANALGESIA) 100 MCG/ML EP SOLN
EPIDURAL | Status: DC | PRN
  Administered 2024-01-27: 100 ug

## 2024-01-27 MED ORDER — DEXAMETHASONE SODIUM PHOSPHATE 10 MG/ML IJ SOLN
INTRAMUSCULAR | Status: AC
  Filled 2024-01-27: qty 1

## 2024-01-27 MED ORDER — FENTANYL CITRATE (PF) 100 MCG/2ML IJ SOLN
INTRAMUSCULAR | Status: AC
Start: 2024-01-27 — End: 2024-01-27
  Filled 2024-01-27: qty 2

## 2024-01-27 MED ORDER — MIDAZOLAM HCL 2 MG/2ML IJ SOLN
INTRAMUSCULAR | Status: AC
Start: 2024-01-27 — End: 2024-01-27
  Filled 2024-01-27: qty 2

## 2024-01-27 MED ORDER — LACTATED RINGERS IV SOLN: INTRAVENOUS | Status: DC

## 2024-01-27 MED ORDER — SODIUM CHLORIDE 0.9 % IR SOLN
Status: DC | PRN
  Administered 2024-01-27: 1000 mL

## 2024-01-27 MED ORDER — KETOROLAC TROMETHAMINE 10 MG PO TABS: 10.0000 mg | ORAL_TABLET | Freq: Four times a day (QID) | ORAL | 0 refills | Status: AC | PRN

## 2024-01-27 MED ORDER — FENTANYL CITRATE (PF) 100 MCG/2ML IJ SOLN
INTRAMUSCULAR | Status: DC | PRN
  Administered 2024-01-27: 50 ug via INTRAVENOUS
  Administered 2024-01-27 (×2): 25 ug via INTRAVENOUS

## 2024-01-27 MED ORDER — GABAPENTIN 300 MG PO CAPS
ORAL_CAPSULE | ORAL | Status: AC
  Filled 2024-01-27: qty 1

## 2024-01-27 MED ORDER — ONDANSETRON HCL 4 MG/2ML IJ SOLN
INTRAMUSCULAR | Status: DC | PRN
  Administered 2024-01-27: 4 mg via INTRAVENOUS

## 2024-01-27 MED ORDER — ONDANSETRON HCL 4 MG PO TABS: 4.0000 mg | ORAL_TABLET | Freq: Three times a day (TID) | ORAL | 0 refills | Status: AC | PRN

## 2024-01-27 MED ORDER — GABAPENTIN 300 MG PO CAPS
300.0000 mg | ORAL_CAPSULE | Freq: Once | ORAL | Status: AC
  Administered 2024-01-27: 300 mg via ORAL

## 2024-01-27 MED ORDER — OXYCODONE HCL 5 MG/5ML PO SOLN: 5.0000 mg | Freq: Once | ORAL | Status: DC | PRN

## 2024-01-27 MED ORDER — OXYCODONE HCL 5 MG PO TABS: 5.0000 mg | ORAL_TABLET | Freq: Once | ORAL | Status: DC | PRN

## 2024-01-27 MED ORDER — MIDAZOLAM HCL 2 MG/2ML IJ SOLN
INTRAMUSCULAR | Status: AC
  Filled 2024-01-27: qty 2

## 2024-01-27 MED ORDER — VANCOMYCIN HCL 1 G IV SOLR
INTRAVENOUS | Status: DC | PRN
  Administered 2024-01-27: 1000 mg via TOPICAL

## 2024-01-27 MED ORDER — FENTANYL CITRATE (PF) 100 MCG/2ML IJ SOLN
INTRAMUSCULAR | Status: AC
  Filled 2024-01-27: qty 2

## 2024-01-27 MED ORDER — PROPOFOL 10 MG/ML IV BOLUS
INTRAVENOUS | Status: AC
  Filled 2024-01-27: qty 20

## 2024-01-27 MED ORDER — ACETAMINOPHEN 500 MG PO TABS
ORAL_TABLET | ORAL | Status: AC
  Filled 2024-01-27: qty 2

## 2024-01-27 SURGICAL SUPPLY — 43 items
ANCHOR DBL 2.6 SLF-PNCH FIBRTK (Anchor) IMPLANT
BLADE SHAVER BONE 5.0X13 (MISCELLANEOUS) IMPLANT
BLADE SURG 10 STRL SS (BLADE) ×1 IMPLANT
BLADE SURG 15 STRL LF DISP TIS (BLADE) ×1 IMPLANT
BNDG ELASTIC 6INX 5YD STR LF (GAUZE/BANDAGES/DRESSINGS) ×1 IMPLANT
BURR OVAL 8 FLU 4.0X13 (MISCELLANEOUS) IMPLANT
CHLORAPREP W/TINT 26 (MISCELLANEOUS) ×1 IMPLANT
CLSR STERI-STRIP ANTIMIC 1/2X4 (GAUZE/BANDAGES/DRESSINGS) ×1 IMPLANT
COOLER ICEMAN CLASSIC (MISCELLANEOUS) ×1 IMPLANT
CUFF TRNQT CYL 34X4.125X (TOURNIQUET CUFF) ×1 IMPLANT
DISSECTOR 4.0MMX13CM CVD (MISCELLANEOUS) ×1 IMPLANT
DRAPE OEC MINIVIEW 54X84 (DRAPES) IMPLANT
DRAPE U-SHAPE 47X51 STRL (DRAPES) ×1 IMPLANT
DRAPE-T ARTHROSCOPY W/POUCH (DRAPES) ×1 IMPLANT
ELECTRODE REM PT RTRN 9FT ADLT (ELECTROSURGICAL) ×1 IMPLANT
GAUZE SPONGE 4X4 12PLY STRL (GAUZE/BANDAGES/DRESSINGS) ×2 IMPLANT
GLOVE BIO SURGEON STRL SZ 6.5 (GLOVE) ×1 IMPLANT
GLOVE BIO SURGEON STRL SZ7 (GLOVE) IMPLANT
GLOVE BIOGEL PI IND STRL 6.5 (GLOVE) ×1 IMPLANT
GLOVE BIOGEL PI IND STRL 8 (GLOVE) ×1 IMPLANT
GLOVE ECLIPSE 8.0 STRL XLNG CF (GLOVE) ×1 IMPLANT
GLOVE INDICATOR 7.5 STRL GRN (GLOVE) IMPLANT
GOWN STRL REUS W/ TWL LRG LVL3 (GOWN DISPOSABLE) ×2 IMPLANT
GOWN STRL REUS W/TWL XL LVL3 (GOWN DISPOSABLE) ×1 IMPLANT
GRAFT TISS SEMITEND 4-8 (Bone Implant) IMPLANT
KIT KNEE FIBERTAK DISP (KITS) IMPLANT
MANIFOLD NEPTUNE II (INSTRUMENTS) ×1 IMPLANT
NDL SUT 6 .5 CRC .975X.05 MAYO (NEEDLE) ×1 IMPLANT
PACK ARTHROSCOPY DSU (CUSTOM PROCEDURE TRAY) ×1 IMPLANT
PACK BASIN DAY SURGERY FS (CUSTOM PROCEDURE TRAY) ×1 IMPLANT
PAD COLD SHLDR WRAP-ON (PAD) ×1 IMPLANT
PENCIL SMOKE EVACUATOR (MISCELLANEOUS) ×1 IMPLANT
SPONGE SURGIFOAM ABS GEL 100 (HEMOSTASIS) IMPLANT
SPONGE T-LAP 4X18 ~~LOC~~+RFID (SPONGE) ×1 IMPLANT
SUT MNCRL AB 4-0 PS2 18 (SUTURE) ×1 IMPLANT
SUT VIC AB 0 CT1 27XBRD ANBCTR (SUTURE) ×1 IMPLANT
SUT VIC AB 3-0 SH 27X BRD (SUTURE) ×1 IMPLANT
SUTURE FIBERWR #2 38 T-5 BLUE (SUTURE) IMPLANT
TENDON SEMI-TENDINOSUS (Bone Implant) ×1 IMPLANT
TOWEL GREEN STERILE FF (TOWEL DISPOSABLE) ×2 IMPLANT
TUBE SUCTION HIGH CAP CLEAR NV (SUCTIONS) ×1 IMPLANT
TUBING ARTHROSCOPY IRRIG 16FT (MISCELLANEOUS) ×1 IMPLANT
WAND ABLATOR APOLLO I90 (BUR) IMPLANT

## 2024-01-27 NOTE — Progress Notes (Signed)
 Assisted Dr. Lasalle Pointer with right, femoral, ultrasound guided block. Side rails up, monitors on throughout procedure. See vital signs in flow sheet. Tolerated Procedure well.

## 2024-01-27 NOTE — Anesthesia Procedure Notes (Signed)
 Procedure Name: LMA Insertion Date/Time: 01/27/2024 8:48 AM  Performed by: Raymona Caldwell, CRNAPre-anesthesia Checklist: Patient identified, Emergency Drugs available, Suction available and Patient being monitored Patient Re-evaluated:Patient Re-evaluated prior to induction Oxygen Delivery Method: Circle system utilized Preoxygenation: Pre-oxygenation with 100% oxygen Induction Type: IV induction Ventilation: Mask ventilation without difficulty LMA: LMA inserted LMA Size: 4.0 Number of attempts: 1 Airway Equipment and Method: Bite block Placement Confirmation: positive ETCO2 Tube secured with: Tape Dental Injury: Teeth and Oropharynx as per pre-operative assessment

## 2024-01-27 NOTE — Anesthesia Postprocedure Evaluation (Signed)
 Anesthesia Post Note  Patient: Carrie Peterson  Procedure(s) Performed: RECONSTRUCTION, LIGAMENT, MEDIAL PATELLOFEMORAL (Right: Knee)     Patient location during evaluation: PACU Anesthesia Type: Regional and General Level of consciousness: awake and alert Pain management: pain level controlled Vital Signs Assessment: post-procedure vital signs reviewed and stable Respiratory status: spontaneous breathing, nonlabored ventilation, respiratory function stable and patient connected to nasal cannula oxygen Cardiovascular status: blood pressure returned to baseline and stable Postop Assessment: no apparent nausea or vomiting Anesthetic complications: no  No notable events documented.  Last Vitals:  Vitals:   01/27/24 1015 01/27/24 1030  BP: (!) 110/57 (!) 110/59  Pulse: 80 70  Resp: 20 18  Temp:  36.4 C  SpO2: 100% 100%    Last Pain:  Vitals:   01/27/24 1030  TempSrc:   PainSc: 4                  Rosalita Combe

## 2024-01-27 NOTE — Anesthesia Procedure Notes (Signed)
 Anesthesia Regional Block: Adductor canal block   Pre-Anesthetic Checklist: , timeout performed,  Correct Patient, Correct Site, Correct Laterality,  Correct Procedure, Correct Position, site marked,  Risks and benefits discussed,  Surgical consent,  Pre-op evaluation,  At surgeon's request and post-op pain management  Laterality: Lower and Right  Prep: chloraprep       Needles:  Injection technique: Single-shot  Needle Type: Echogenic Needle     Needle Length: 9cm  Needle Gauge: 22     Additional Needles:   Procedures:,,,, ultrasound used (permanent image in chart),,    Narrative:  Start time: 01/27/2024 7:48 AM End time: 01/27/2024 7:53 AM Injection made incrementally with aspirations every 5 mL.  Performed by: Personally  Anesthesiologist: Rosalita Combe, MD  Additional Notes: Block assessed prior to surgery. Pt tolerated procedure well.

## 2024-01-27 NOTE — Transfer of Care (Signed)
 Immediate Anesthesia Transfer of Care Note  Patient: Carrie Peterson  Procedure(s) Performed: RECONSTRUCTION, LIGAMENT, MEDIAL PATELLOFEMORAL (Right: Knee)  Patient Location: PACU  Anesthesia Type:General and Regional  Level of Consciousness: awake, drowsy, and patient cooperative  Airway & Oxygen Therapy: Patient Spontanous Breathing and Patient connected to face mask oxygen  Post-op Assessment: Report given to RN and Post -op Vital signs reviewed and stable  Post vital signs: Reviewed and stable  Last Vitals:  Vitals Value Taken Time  BP 90/46 01/27/24 09:36  Temp    Pulse 66 01/27/24 09:38  Resp 13 01/27/24 09:38  SpO2 99 % 01/27/24 09:38  Vitals shown include unfiled device data.  Last Pain:  Vitals:   01/27/24 0658  TempSrc: Temporal  PainSc: 0-No pain         Complications: No notable events documented.

## 2024-01-27 NOTE — Op Note (Signed)
 Orthopaedic Surgery Operative Note (CSN: [id number])  Carrie Peterson  May 14, 1996 Date of Surgery: 01/27/2024   Diagnoses:  Right recurrent patellofemoral instability  Procedure: Right MPFL reconstruction chondroplasty   Operative Finding Easily dislocatable patella preop, full range of motion 3 degrees of hyperextension.  Full examination medial compartment was normal, patellofemoral compartment had an obvious lateral tilt and translation of the patella though it was not excessive so I did not feel that it is a necessitated a lateral release.  Lateral compartment chondral changes on the tibia grade 2 and 3.  We debrided this back to stable base for chondroplasty.  MPFL reconstruction was routine with good fixation.  Successful completion of the planned procedure.    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  We talked about nonnarcotic adjuvant medicines and continuing methadone postoperatively to control pain.  Patient agreed with this plan of care will not be prescribed any narcotics from our office.  Post-operative plan: The patient will be weightbearing to tolerance.  The patient will be discharged home.  DVT prophylaxis Aspirin 81 mg twice daily for 6 weeks.  Pain control with PRN pain medication preferring oral medicines.  Follow up plan will be scheduled in approximately 7 days for incision check and XR.  Post-Op Diagnosis: Same Surgeons:Primary: Micheline Ahr, MD Assistants:Caroline McBane, PA-C and Rozanna Corner, RNFA Location: [facility] OR ROOM 1 Anesthesia: General with local anesthetic Antibiotics: Ancef 2 g with local vancomycin powder 1 g at the surgical site Tourniquet time: 20 Estimated Blood Loss: Minimal Complications: None Specimens: None Implants: * No implants in log *  Indications for Surgery:   Carrie Peterson is a 28 y.o. female with recurrent patellofemoral instability.  Benefits and risks of operative and nonoperative management were discussed  prior to surgery with patient/guardian(s) and informed consent form was completed.  Specific risks including infection, need for additional surgery, recurrent instability, stiffness, fracture amongst others.   Procedure:   The patient was identified properly. Informed consent was obtained and the surgical site was marked. The patient was taken up to suite where general anesthesia was induced. The patient was placed in the supine position with a post against the surgical leg and a nonsterile tourniquet applied. The surgical leg was then prepped and draped usual sterile fashion.  A standard surgical timeout was performed.  2 standard anterior portals were made and diagnostic arthroscopy performed. Please note the findings as noted above.  We used a shaver to perform an anterior, medial and lateral synovectomy.  This allowed appropriate visualization as well as avoiding anterior interval scarring.  Attention was turned to the proximal medial patella where a proximal medial patellar skin incision was made and carried down through the skin and subcutaneous tissue.  The medial border of the patella was exposed down to layer 3.  We tagged the superficial tissue which was consistent with the attenuated MPFL remnant.  The joint was not entered.  We then used 2 -2.6 mm arthrex Fibertak knotless anchors placed at the proximal 25% and 50% marks of the patella from proximal to distal transversely.  These would be used to hold our graft in place using their knotless suture loops.  We passed the graft through our loops of suture based on the anchors x4 and secured each.  Our graft was prepped in the form of a doubled over semitendinosus allograft graft that passed through a 6.38mm tunnel.   This was secured as above to the patella at its mid  portion and the two loose tails were then passed under layer 2 to the medial epicondyle.  We then made a 3 cm approach starting at the medial epicondyle extending just proximal and  posterior.  We took care to dissect the superficial tissues bluntly and used blunt retraction to ensure that the neurovascular structures were out of our field.   We identified the medial epicondyle.  Blunt dissection was performed below the fascia outside of the capsule from the medial patella to the adductor tubercle.    As we were performing an onlay type fixation on the femur we utilized an Arthrex 2.6 mm fiber tack made for the knee with 2 knotless loops was placed.  We used fluoroscopy with a large C arm to guide our position.  Once we are happy with our position we advanced the spade tipped wire for this anchor to its full depth as is limited by the guide.  We then impacted our fiber tack button into place.  We checked its fixation by setting it.   We cleared the soft tissue at this location to allow bony on growth of the tendon.  We placed our this anchor as we did in the patella.  At this point we shuttled our graft above layer 3 to our medial femoral incision.  We pulled it through 1 loop of our knotless button and held the knee in 30 degrees of flexion with about 10 mm of translation laterally of the patella.  We secured the limbs.  We then used a free needle to pass 1 limb of each suture through the graft to avoid pull through and tied alternating half hitches.  Were happy with the overall tension.  The native MPFL tissue was repaired at both its patellar and femoral origins in a pants over vest style fashion to imbricate this loose tissue with 0 Vicryl.  All incisions were irrigated copiously and vancomycin powder was placed prior to closure in a multilayer fashion with absorbable suture.  Sterile dressing and a hinged knee immobilizer type brace were placed.   Incisions closed with absorbable suture. The patient was awoken from general anesthesia and taken to the PACU in stable condition without complication.   Nicholas Bari, PA-C, present and scrubbed throughout the case, critical for  completion in a timely fashion, and for retraction, instrumentation, closure.

## 2024-01-27 NOTE — Interval H&P Note (Signed)
 All questions answered, patient wants to proceed with procedure.   Discusssed methadone use, no additional narcotics, patient agreed.

## 2024-01-28 ENCOUNTER — Encounter (HOSPITAL_COMMUNITY): Payer: Self-pay

## 2024-01-28 ENCOUNTER — Emergency Department (HOSPITAL_COMMUNITY)
Admission: EM | Admit: 2024-01-28 | Discharge: 2024-01-28 | Disposition: A | Discharge status: Home / Self Care | Payer: MEDICAID | Attending: Emergency Medicine | Admitting: Emergency Medicine

## 2024-01-28 ENCOUNTER — Ambulatory Visit: Payer: Self-pay

## 2024-01-28 ENCOUNTER — Other Ambulatory Visit: Payer: Self-pay

## 2024-01-28 DIAGNOSIS — G8918 Other acute postprocedural pain: Secondary | ICD-10-CM | POA: Insufficient documentation

## 2024-01-28 DIAGNOSIS — Z7982 Long term (current) use of aspirin: Secondary | ICD-10-CM | POA: Diagnosis not present

## 2024-01-28 DIAGNOSIS — Z79899 Other long term (current) drug therapy: Secondary | ICD-10-CM | POA: Diagnosis not present

## 2024-01-28 MED ORDER — KETOROLAC TROMETHAMINE 60 MG/2ML IM SOLN
30.0000 mg | Freq: Once | INTRAMUSCULAR | Status: AC
  Administered 2024-01-28: 30 mg via INTRAMUSCULAR
  Filled 2024-01-28: qty 2

## 2024-01-28 MED ORDER — ACETAMINOPHEN 500 MG PO TABS
1000.0000 mg | ORAL_TABLET | Freq: Once | ORAL | Status: AC
  Administered 2024-01-28: 1000 mg via ORAL
  Filled 2024-01-28: qty 2

## 2024-01-28 MED ORDER — NALOXONE HCL 4 MG/0.1ML NA LIQD: NASAL | 0 refills | Status: AC

## 2024-01-28 MED ORDER — OXYCODONE-ACETAMINOPHEN 5-325 MG PO TABS: 1.0000 | ORAL_TABLET | ORAL | 0 refills | Status: AC | PRN

## 2024-01-28 MED ORDER — OXYCODONE HCL 5 MG PO TABS
5.0000 mg | ORAL_TABLET | Freq: Once | ORAL | Status: AC
  Administered 2024-01-28: 5 mg via ORAL
  Filled 2024-01-28: qty 1

## 2024-01-28 MED ORDER — METHOCARBAMOL 500 MG PO TABS
500.0000 mg | ORAL_TABLET | Freq: Once | ORAL | Status: AC
  Administered 2024-01-28: 500 mg via ORAL
  Filled 2024-01-28: qty 1

## 2024-01-28 NOTE — ED Triage Notes (Addendum)
 Pt had knee surgery yesterday and is having pain today. Pt states she has taken her medication and has not been able to get pain under control. Pt denies numbness/tingling in right leg, +PMS distal to knee.

## 2024-01-28 NOTE — ED Provider Notes (Cosign Needed)
 [place] EMERGENCY DEPARTMENT AT [facility] Provider Note   CSN: [id number] Arrival date & time: 01/28/24  4696     Patient presents with: Post-op Problem   Carrie Peterson is a 28 y.o. female.   Pt complains of pain in her right leg after having surgery yesterday.  Patient had a PTL repair.  Patient is currently on methadone treatment.  Patient was given ibuprofen  Tylenol  and gabapentin for pain.  She spoke to her provider's office and they called in prednisone .  Patient states that she cannot take prednisone .  Patient was given a Percocet and a shot of Toradol  at her arrival in the emergency department.  She reports minimal relief.  Patient denies any other complaint other than pain.  The history is provided by the patient. No language interpreter was used.       Prior to Admission medications   Medication Sig Start Date End Date Taking? Authorizing Provider  naloxone [facility]) nasal spray 4 mg/0.1 mL Use as directed 01/28/24  Yes Kwabena Strutz K, PA-C  oxyCODONE -acetaminophen  (PERCOCET) 5-325 MG tablet Take 1 tablet by mouth every 4 (four) hours as needed for severe pain (pain score 7-10). 01/28/24 01/27/25 Yes Maciah Schweigert K, PA-C  acetaminophen  (TYLENOL ) 500 MG tablet Take 2 tablets (1,000 mg total) by mouth every 8 (eight) hours for 14 days. 01/27/24 02/10/24  McBane, Caroline N, PA-C  albuterol  (PROVENTIL ) (5 MG/ML) 0.5% nebulizer solution Take 0.5 mLs (2.5 mg total) by nebulization every 6 (six) hours as needed for wheezing or shortness of breath. 04/25/23   Iva Mariner, MD  aspirin (ASPIRIN CHILDRENS) 81 MG chewable tablet Chew 1 tablet (81 mg total) by mouth 2 (two) times daily. For 6 weeks for DVT prophylaxis after surgery 01/27/24 03/09/24  McBane, Caroline N, PA-C  baclofen (LIORESAL) 10 MG tablet Take 1 tablet (10 mg total) by mouth every 6 (six) hours as needed for muscle spasms. 01/27/24   McBane, Lonnell Rob, PA-C  etonogestrel  (NEXPLANON ) 68 MG IMPL implant 1 each by  Subdermal route once.    [provider]  gabapentin (NEURONTIN) 300 MG capsule Take 1 capsule (300 mg total) by mouth 2 (two) times daily for 14 days. For 2 weeks post op for pain. 01/27/24 02/10/24  McBane, Caroline N, PA-C  ibuprofen  (ADVIL ) 800 MG tablet Take 1 tablet (800 mg total) by mouth every 8 (eight) hours as needed for up to 14 days for mild pain (pain score 1-3) or moderate pain (pain score 4-6). 01/27/24 02/10/24  McBane, Caroline N, PA-C  ketorolac  (TORADOL ) 10 MG tablet Take 1 tablet (10 mg total) by mouth every 6 (six) hours as needed for up to 4 days for severe pain (pain score 7-10). DO NOT TAKE WITH IBUPROFEN  01/27/24 01/31/24  Adine Ahmadi, PA-C  methadone (DOLOPHINE) 10 MG/5ML solution Take 140 mg by mouth daily.    [provider]  ondansetron  (ZOFRAN ) 4 MG tablet Take 1 tablet (4 mg total) by mouth every 8 (eight) hours as needed for up to 7 days for nausea or vomiting. 01/27/24 02/03/24  Adine Ahmadi, PA-C    Allergies: Patient has no known allergies.    Review of Systems  Musculoskeletal:  Positive for joint swelling.  All other systems reviewed and are negative.   Updated Vital Signs BP 108/78 (BP Location: Right Arm)   Pulse 71   Temp 98 F (36.7 C) (Oral)   Resp 19   Ht 5' 6 (1.676 m)   Wt  79.4 kg   LMP 01/06/2024 (Approximate)   SpO2 98%   BMI 28.25 kg/m   Physical Exam Vitals and nursing note reviewed.  Constitutional:      Appearance: She is well-developed.  HENT:     Head: Normocephalic.     Mouth/Throat:     Mouth: Mucous membranes are moist.   Cardiovascular:     Rate and Rhythm: Normal rate and regular rhythm.  Pulmonary:     Effort: Pulmonary effort is normal.  Abdominal:     General: There is no distension.   Musculoskeletal:     Comments: Patient in immobilizer   Skin:    General: Skin is warm.   Neurological:     General: No focal deficit present.     Mental Status: She is alert and oriented to person,  place, and time.   Psychiatric:        Mood and Affect: Mood normal.     (all labs ordered are listed, but only abnormal results are displayed) Labs Reviewed - No data to display  EKG: None  Radiology: DG MINI C-ARM IMAGE ONLY Result Date: 01/27/2024 There is no interpretation for this exam.  This order is for images obtained during a surgical procedure.  Please See Surgeries Tab for more information regarding the procedure.     Procedures   Medications Ordered in the ED  oxyCODONE  (Oxy IR/ROXICODONE ) immediate release tablet 5 mg (5 mg Oral Given 01/28/24 0943)  acetaminophen  (TYLENOL ) tablet 1,000 mg (1,000 mg Oral Given 01/28/24 0943)  ketorolac  (TORADOL ) injection 30 mg (30 mg Intramuscular Given 01/28/24 0943)  methocarbamol (ROBAXIN) tablet 500 mg (500 mg Oral Given 01/28/24 0943)                                    Medical Decision Making Pt complains of pain after surgery.    Amount and/or Complexity of Data Reviewed Discussion of management or test interpretation with external provider(s): I spoke to PA for pt's surgeon.  I will give pt limited rx for pain medication.    Risk Prescription drug management.        Final diagnoses:  Post-operative pain    ED Discharge Orders          Ordered    oxyCODONE -acetaminophen  (PERCOCET) 5-325 MG tablet  Every 4 hours PRN        01/28/24 1242    naloxone (NARCAN) nasal spray 4 mg/0.1 mL        01/28/24 1242            An After Visit Summary was printed and given to the patient.    Sandi Crosby, PA-C 01/28/24 1521

## 2024-01-28 NOTE — ED Provider Triage Note (Signed)
 Emergency Medicine Provider Triage Evaluation Note  Carrie Peterson , a 28 y.o. female  was evaluated in triage.  Pt complains of right knee pain.  Had ligament reconstruction yesterday w/ ortho in her R knee. Was given tylenol , baclofen, and tramadol  for pain control but it's not helping. Nerve block wore off in the night last night and has since been in 8/10 pain. Came to ED seeking another nerve block. Has had an opiate addition in the past requiring treatment with methadone. Last took toradol  10 mg and tylenol  500 mg at 2 am. Still taking methadone 140 mg, been clean for two years.  Review of Systems  Positive: R knee pain Negative: Numbness/tingling  Physical Exam  BP (!) 109/93 (BP Location: Right Arm)   Pulse 79   Temp 98.3 F (36.8 C)   Ht 5' 6 (1.676 m)   Wt 79.4 kg   LMP 01/06/2024 (Approximate)   SpO2 100%   BMI 28.25 kg/m  Gen:   Awake, no distress   Resp:  Normal effort  MSK:   R knee in post-op knee immobilizer. No significant swelling distal extremity. NVI. Compartments soft.   Medical Decision Making  Medically screening exam initiated at 9:34 AM.  Appropriate orders placed.  Carrie Peterson was informed that the remainder of the evaluation will be completed by another provider, this initial triage assessment does not replace that evaluation, and the importance of remaining in the ED until their evaluation is complete.  Discussed with patient about not doing femoral nerve block in ED. Discussed additional pain control methods. Need for acute pain control, as long-term maintenance therapy with methadone is not sufficient for acute post-op pain. Patient taking insufficient tylenol , will give additional 1,000 mg here and also add oxycodone  5 mg. Discussed this with patient. Will also add robaxin and toradol  IM. Plan for reevaluation and likely DC.   Merdis Stalling, MD 01/28/24 [phone]

## 2024-01-28 NOTE — Telephone Encounter (Signed)
 Copied from CRM [id number]. Topic: Clinical - Red Word Triage >> Jan 28, 2024  8:47 AM Turkey B wrote: [facility] that prompted transfer to Nurse Triage:  patient has severe pain in right knee   Patient reports pain n the middle of the night, says that she spoke with her surgeron and was advised that she should go to the hospital to get her pain level treated. Pt asking which ED is better and what the wait time is. RN advised that patients are treated by level of severity so a wait time could not be provided. Pt verbalized understanding, says she will go to ED at [facility]

## 2024-02-08 NOTE — Therapy (Signed)
 OUTPATIENT PHYSICAL THERAPY LOWER EXTREMITY EVALUATION   Patient Name: Carrie Peterson MRN: [id number] DOB:1995-11-23, 28 y.o., female Today's Date: 02/09/2024  END OF SESSION:  PT End of Session - 02/09/24 1507     Visit Number 1    Number of Visits 17    Date for PT Re-Evaluation 04/12/24    Authorization - Visit Number 1    PT Start Time 1500    PT Stop Time 1550    PT Time Calculation (min) 50 min    Activity Tolerance Patient tolerated treatment well    Behavior During Therapy [facility] for tasks assessed/performed          Past Medical History:  Diagnosis Date   Anxiety    Cervical cancer screening 12/23/2016   Overview:   01/2016 per patient PAP WNL, HR HPV negative     Last Assessment & Plan:   01/2016 per patient PAP WNL, HR HPV negative     Depression    Fracture 02/13/2014   Back, 5 years ago.   Nondisplaced fracture of base of fifth metacarpal bone, right hand, initial encounter for closed fracture 12/23/2016   Overview:   Short term disability receipt #[id number]     Last Assessment & Plan:   Being seen by orthopedics  Currently not working b/c of this  Patient filed for short term disability     PTSD (post-traumatic stress disorder)    Spontaneous vaginal delivery 03/07/2019   Ulcer    Past Surgical History:  Procedure Laterality Date   addenoidectomy     MEDIAL PATELLOFEMORAL LIGAMENT REPAIR Right 01/27/2024   Procedure: RECONSTRUCTION, LIGAMENT, MEDIAL PATELLOFEMORAL;  Surgeon: Cristy Bonner DASEN, MD;  Location: [facility];  Service: Orthopedics;  Laterality: Right;  Knee arthroscopy with debridement and medial patellofemoral ligament reconstruction   TONSILLECTOMY AND ADENOIDECTOMY     Patient Active Problem List   Diagnosis Date Noted   Reactive airway disease 04/25/2023   PNA (pneumonia) 04/25/2023   Acute respiratory failure with hypoxia (HCC) 04/25/2023   Anxiety and depression 12/23/2016   Nausea in adult 12/23/2016   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 Pt underwent MPFL reconstruction on 01/27/24, reports her recovery is going well.  PERTINENT HISTORY: PTSD  PAIN:  Are you having pain? Yes: NPRS scale: 0/10. Pain range on eval: 0-7/10 Pain location: R knee Pain description: sharp Aggravating factors: prolonged walking, sometimes for no apparrant reason Relieving factors: Rest, ice  and elevation  PRECAUTIONS: Knee    RED FLAGS: None   WEIGHT BEARING RESTRICTIONS: No: WBAT  FALLS:  Has patient fallen in last 6 months? No  LIVING ENVIRONMENT: Lives with: lives with their family Lives in: House/apartment Stairs: Yes 1 to enter home Has following equipment at home: Hinged knee brace locked at [employer]   OCCUPATION: Not working  PLOF: Independent  PATIENT GOALS: Full ROM and good functional use  NEXT MD VISIT: 3 weeks  OBJECTIVE:  Note: Objective measures were completed at Evaluation unless otherwise noted.  DIAGNOSTIC FINDINGS: None found in Epic  PATIENT SURVEYS:  LEFS: 24/80  COGNITION: Overall cognitive status: Within functional limits for tasks assessed     SENSATION: WFL  EDEMA:  Minimal swelling observed and palapted of the R knee  MUSCLE LENGTH: Hamstrings: Right NT deg; Left NT deg Debby test: Right NT deg; Left NT deg  POSTURE: No Significant postural  limitations  PALPATION: TTP to the R peri-knee  LOWER EXTREMITY ROM:  Active ROM Right eval Left eval  Hip flexion    Hip extension    Hip abduction    Hip adduction    Hip internal rotation    Hip external rotation    Knee flexion 75   Knee extension 0   Ankle dorsiflexion    Ankle plantarflexion    Ankle inversion    Ankle eversion     (Blank rows = not tested)  LOWER EXTREMITY MMT:   Able to complete a SLR x10 c knee brace on. Grossly 3/5 for R LE MMT Right eval Left eval  Hip flexion    Hip extension    Hip abduction    Hip adduction    Hip internal rotation    Hip external rotation    Knee flexion    Knee extension    Ankle dorsiflexion    Ankle plantarflexion    Ankle inversion    Ankle eversion     (Blank rows = not tested)  LOWER EXTREMITY SPECIAL TESTS:  NT  FUNCTIONAL TESTS:  5 times sit to stand: TBA 2 minute walk test: TBA  GAIT: Distance walked: 200' Assistive device utilized: Hinged knee brace locked at [employer] Level of assistance: Modified independence Comments: Walks with good balance with knee brace on                                                                                                                                TREATMENT DATE:  [facility] Adult PT Treatment:                                                DATE: 02/09/24 Therapeutic Exercise: Developed, instructed in, and pt completed therex as noted in HEP  Therapeutic Activity: Instruction to asc/dsc steps Modalities: Cold pack 10 mins to the R knee for symptom    PATIENT EDUCATION:  Education details: Eval findings, POC, HEP, self care  Person educated: Patient Education method: Explanation, Demonstration, Tactile cues, Verbal cues, and Handouts Education comprehension: verbalized understanding, returned demonstration, verbal cues required, and tactile cues required  HOME EXERCISE PROGRAM: Access Code: [email or web address] URL: [email or web address] Date:  02/09/2024 Prepared by: Dasie Daft  Exercises - Supine Heel Slide with Strap  - 2 x daily - 7 x weekly -  1 sets - 5 reps - 30 hold - Supine Heel Slide  - 2 x daily - 7 x weekly - 1 sets - 10 reps - 3 hold - Supine Quad Set  - 2 x daily - 7 x weekly - 1 sets - 10 reps - 5 hold - Active Straight Leg Raise with Quad Set  - 2 x daily - 7 x weekly - 1 sets - 10 reps - 3 hold - Supine Isometric Hamstring Set  - 2 x daily - 7 x weekly - 1 sets - 10 reps - 5 hold  ASSESSMENT:  CLINICAL IMPRESSION: Patient is a 28 y.o. female who was seen today for physical therapy evaluation and treatment for Right knee arthroscopy with MPFL reconstruction 01/27/24. Rehab was initiated today as noted above in treatment. R knee ROM and LE strength levels are appropriate for time frame s/p surgery. Pt was provided with a HEP to complete 2x daily. Pt will benefit from skilled PT 1-2x/wk for 8 weeks to address impairments to optimize R knee/LE function with less pain.   OBJECTIVE IMPAIRMENTS: decreased activity tolerance, decreased balance, difficulty walking, decreased ROM, decreased strength, increased edema, and pain.   ACTIVITY LIMITATIONS: lifting, bending, sitting, standing, squatting, stairs, locomotion level, and caring for others  PARTICIPATION LIMITATIONS: meal prep, cleaning, laundry, driving, shopping, and community activity  PERSONAL FACTORS: Past/current experiences and Time since onset of injury/illness/exacerbation are also affecting patient's functional outcome.   REHAB POTENTIAL: Good  CLINICAL DECISION MAKING: Evolving/moderate complexity  EVALUATION COMPLEXITY: Moderate   GOALS: Goals reviewed with patient? Yes  SHORT TERM GOALS: Target date: 02/25/24 Pt will be Ind in an initial HEP  Baseline: started Goal status: INITIAL  2.  Pt will voice understanding of measures to assist in pain reduction  Baseline: started Goal status: INITIAL  LONG TERM GOALS: Target date: 04/12/24  Pt will be  Ind in a final HEP to maintain achieved LOF  Baseline:  Goal status: INITIAL  2.  Pt will demonstrate AROM of the R knee of 0-125 for appropriate R knee function for step negotiation, squatting, and sitting Baseline: 0-75 Goal status: INITIAL  3.  Pt will demonstrate R knee and hip strength of 4+/5 for appropriate R knee function with daily activities Baseline: Grossly 3/5 for R LE Goal status: INITIAL  4.  Improve 5xSTS by MCID of 5 and by MCID of 21ft as indication of improved functional mobility  Baseline: TBA when pt is able to walk without the knee brace Goal status: INITIAL  5.  Pt will be Ind with ambulation walking with a normalized gait pattern and to be able to negotiate steps in a reciprocal pattern with only HR assist Baseline:  Goal status: INITIAL  6.  Pt's LEFS score will improve by the MCID to 50% or greater as indication of improved function  Baseline: 30% Goal status: INITIAL   PLAN:  PT FREQUENCY: 1-2x/week  PT DURATION: 8 weeks  PLANNED INTERVENTIONS: 97164- PT Re-evaluation, 97110-Therapeutic exercises, 97530- Therapeutic activity, 97112- Neuromuscular re-education, 97535- Self Care, 02859- Manual therapy, [id number]- Gait training, [phone]- Aquatic Therapy, [phone]- Electrical stimulation (unattended), 97016- Vasopneumatic device, 20560 (1-2 muscles), 20561 (3+ muscles)- Dry Needling, Patient/Family education, Balance training, Stair training, Taping, Joint mobilization, Cryotherapy, and Moist heat  PLAN FOR NEXT SESSION: Assess response to HEP; progress therex as indicated; use of modalities, manual therapy; and TPDN as indicated.   Dorthie Santini MS, PT 02/10/24 6:16 AM

## 2024-02-09 ENCOUNTER — Other Ambulatory Visit: Payer: Self-pay

## 2024-02-09 ENCOUNTER — Ambulatory Visit: Payer: MEDICAID | Attending: Orthopaedic Surgery

## 2024-02-09 DIAGNOSIS — M25561 Pain in right knee: Secondary | ICD-10-CM | POA: Insufficient documentation

## 2024-02-09 DIAGNOSIS — M6281 Muscle weakness (generalized): Secondary | ICD-10-CM | POA: Insufficient documentation

## 2024-02-09 DIAGNOSIS — R262 Difficulty in walking, not elsewhere classified: Secondary | ICD-10-CM | POA: Diagnosis present

## 2024-02-09 DIAGNOSIS — G8929 Other chronic pain: Secondary | ICD-10-CM | POA: Diagnosis present

## 2024-02-09 DIAGNOSIS — R600 Localized edema: Secondary | ICD-10-CM | POA: Insufficient documentation

## 2024-02-17 ENCOUNTER — Encounter: Payer: MEDICAID | Admitting: Physical Therapy

## 2024-02-24 ENCOUNTER — Encounter: Payer: Self-pay | Admitting: Physical Therapy

## 2024-02-24 ENCOUNTER — Ambulatory Visit: Payer: MEDICAID | Admitting: Physical Therapy

## 2024-02-24 DIAGNOSIS — M6281 Muscle weakness (generalized): Secondary | ICD-10-CM

## 2024-02-24 DIAGNOSIS — G8929 Other chronic pain: Secondary | ICD-10-CM

## 2024-02-24 DIAGNOSIS — M25561 Pain in right knee: Secondary | ICD-10-CM | POA: Diagnosis not present

## 2024-02-24 NOTE — Therapy (Addendum)
 OUTPATIENT PHYSICAL THERAPY LOWER EXTREMITY EVALUATION/DC   Patient Name: Carrie Peterson MRN: [id number] DOB:01-28-1996, 28 y.o., female Today's Date: 02/24/2024  END OF SESSION:  PT End of Session - 02/24/24 1018     Visit Number 2    Number of Visits 17    Date for PT Re-Evaluation 04/12/24    Authorization Type [employer] 12 visits    Authorization Time Period 02/09/24-03/25/24    Authorization - Visit Number 2    Authorization - Number of Visits 12    PT Start Time 1018    PT Stop Time 1056    PT Time Calculation (min) 38 min          Past Medical History:  Diagnosis Date   Anxiety    Cervical cancer screening 12/23/2016   Overview:   01/2016 per patient PAP WNL, HR HPV negative     Last Assessment & Plan:   01/2016 per patient PAP WNL, HR HPV negative     Depression    Fracture 02/13/2014   Back, 5 years ago.   Nondisplaced fracture of base of fifth metacarpal bone, right hand, initial encounter for closed fracture 12/23/2016   Overview:   Short term disability receipt #[id number]     Last Assessment & Plan:   Being seen by orthopedics  Currently not working b/c of this  Patient filed for short term disability     PTSD (post-traumatic stress disorder)    Spontaneous vaginal delivery 03/07/2019   Ulcer    Past Surgical History:  Procedure Laterality Date   addenoidectomy     MEDIAL PATELLOFEMORAL LIGAMENT REPAIR Right 01/27/2024   Procedure: RECONSTRUCTION, LIGAMENT, MEDIAL PATELLOFEMORAL;  Surgeon: Cristy Bonner DASEN, MD;  Location: [facility];  Service: Orthopedics;  Laterality: Right;  Knee arthroscopy with debridement and medial patellofemoral ligament reconstruction   TONSILLECTOMY AND ADENOIDECTOMY     Patient Active Problem List   Diagnosis Date Noted   Reactive airway disease 04/25/2023   PNA (pneumonia) 04/25/2023   Acute respiratory failure with hypoxia (HCC) 04/25/2023   Anxiety and depression 12/23/2016   Nausea in adult 12/23/2016    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 Pt underwent MPFL reconstruction on 01/27/24, reports her recovery is going well.  PERTINENT HISTORY: PTSD  PAIN:  Are you having pain? Yes: NPRS scale: 0/10. Pain range on eval: 0-7/10 Pain location: R knee Pain description: sharp Aggravating factors: prolonged walking, sometimes for no apparrant reason Relieving factors: Rest, ice  and elevation  PRECAUTIONS: Knee     Phase II (4-12 wks): Transition phase Weight bearing: Full Brace: Discontinue if good quad control. If struggling at 4 weeks unlock to 0-30 and  then 5 weeks 0-90 discontinuing at week 6. Removed for sleeping at week 4. ROM: Passive ROM as tolerated with gentle end range stretching. AROM and  AAROM to tolerance without resistance. Ice: Not directly on skin. Recommend as much as possible at minimum after PT. Exercises: Once no lag on SLR  and no limp during gait (usually by 6 wks), can  begin closed-chain quad/core and hamstring strengthening as follows: for weeks 4- 6, only do strengthening with knee bent 60 degrees or more; after 6 weeks, can  begin to advance closed chain strengthening at progressively greater degrees of  extension (advance ~20 degrees per week, such that strengthening is done from full  extension to full flexion by 3 months).  RED FLAGS: None   WEIGHT BEARING RESTRICTIONS: No: WBAT  FALLS:  Has patient fallen in last 6  months? No  LIVING ENVIRONMENT: Lives with: lives with their family Lives in: House/apartment Stairs: Yes 1 to enter home Has following equipment at home: Hinged knee brace locked at [employer]   OCCUPATION: Not working  PLOF: Independent  PATIENT GOALS: Full ROM and good functional use  NEXT MD VISIT: 3 weeks  OBJECTIVE:  Note: Objective measures were completed at Evaluation unless otherwise noted.  DIAGNOSTIC FINDINGS: None found in Epic  PATIENT SURVEYS:  LEFS: 24/80  COGNITION: Overall cognitive status: Within functional limits for tasks assessed     SENSATION: WFL  EDEMA:  Minimal swelling observed and palapted of the R knee  MUSCLE LENGTH: Hamstrings: Right NT deg; Left NT deg Debby test: Right NT deg; Left NT deg  POSTURE: No Significant postural limitations  PALPATION: TTP to the R peri-knee  LOWER EXTREMITY ROM:  Active ROM Right eval Left eval Right  02/24/24  Hip flexion     Hip extension     Hip abduction     Hip adduction     Hip internal rotation     Hip external rotation     Knee flexion 75  98 AAROM  Knee extension 0    Ankle dorsiflexion     Ankle plantarflexion     Ankle inversion     Ankle eversion      (Blank rows = not tested)  LOWER EXTREMITY MMT:   Able to complete a SLR x10 c knee brace on. Grossly 3/5 for R LE MMT Right eval Left eval  Hip flexion    Hip extension    Hip abduction    Hip adduction    Hip internal rotation    Hip external rotation    Knee flexion    Knee extension    Ankle dorsiflexion    Ankle plantarflexion    Ankle inversion    Ankle eversion     (Blank rows = not tested)  LOWER EXTREMITY SPECIAL TESTS:  NT  FUNCTIONAL TESTS:  5 times sit to stand: TBA 2 minute walk test: TBA  GAIT: Distance walked: 200' Assistive device utilized: Hinged knee brace locked at 0d Level of assistance: Modified independence Comments: Walks with good balance with knee brace on  TREATMENT DATE:  [facility] Adult PT Treatment:                                                DATE: 02/24/24  Right SLR 3 way 10 x 2 each  QS into towel 5 sec 2 x 10  Prone h/s curl x 10 Supine heel slide x 10  Supine heel slide AA with slide board and strap x 10     [facility] Adult PT Treatment:                                                DATE: 02/09/24 Therapeutic Exercise: Developed, instructed in, and pt completed therex as noted in HEP  Therapeutic Activity: Instruction to asc/dsc steps Modalities: Cold pack 10 mins to the R knee for symptom    PATIENT EDUCATION:  Education details: Eval findings, POC, HEP, self care  Person educated: Patient Education method: Explanation, Demonstration, Tactile cues, Verbal cues, and Handouts Education comprehension: verbalized understanding, returned demonstration, verbal cues required, and tactile cues required  HOME EXERCISE PROGRAM: Access Code: [email or web address] URL: [email or web address] Date: 02/09/2024 Prepared by: Dasie Daft  Exercises - Supine Heel Slide with Strap  - 2 x daily - 7 x weekly - 1 sets - 5 reps - 30 hold - Supine Heel Slide  - 2 x daily - 7 x weekly - 1 sets - 10 reps - 3 hold - Supine Quad Set  - 2 x daily - 7 x weekly - 1 sets - 10 reps - 5 hold - Active Straight Leg Raise with Quad Set  - 2 x daily - 7 x weekly - 1 sets - 10 reps - 3 hold - Supine Isometric Hamstring Set  - 2 x daily - 7 x weekly - 1 sets - 10 reps - 5 hold  ASSESSMENT:  CLINICAL IMPRESSION: Pt is 4 weeks Post op. Sees PA-C today for follow up. No quad lag. She hopes to be discontinued from hinge brace. Her AROM has improved to 90 and AAROM to 98. Will continue per protocol.    EVAL: Patient is a 28 y.o. female who was seen today for physical therapy evaluation and treatment for Right knee arthroscopy with MPFL reconstruction 01/27/24. Rehab was  initiated today as noted above in treatment. R knee ROM and LE strength levels are appropriate for time frame s/p surgery. Pt was provided with a HEP to complete 2x daily. Pt will benefit from skilled PT 1-2x/wk for 8 weeks to address impairments to optimize R knee/LE function with less pain.   OBJECTIVE IMPAIRMENTS: decreased activity tolerance, decreased balance, difficulty walking, decreased ROM, decreased strength, increased edema, and pain.   ACTIVITY LIMITATIONS: lifting, bending, sitting, standing, squatting, stairs, locomotion level, and caring for others  PARTICIPATION LIMITATIONS: meal prep, cleaning, laundry, driving, shopping, and community activity  PERSONAL FACTORS: Past/current experiences and Time since onset of injury/illness/exacerbation are also affecting patient's functional outcome.   REHAB POTENTIAL: Good  CLINICAL DECISION MAKING: Evolving/moderate complexity  EVALUATION COMPLEXITY: Moderate   GOALS: Goals reviewed with patient? Yes  SHORT TERM GOALS: Target date: 02/25/24 Pt will be Ind in an initial HEP  Baseline: started Goal status: MET  2.  Pt will voice understanding  of measures to assist in pain reduction  Baseline: started 02/24/24: understands use of RICE , does not currently have an issue with pain  Goal status: MET   LONG TERM GOALS: Target date: 04/12/24  Pt will be Ind in a final HEP to maintain achieved LOF  Baseline:  Goal status: INITIAL  2.  Pt will demonstrate AROM of the R knee of 0-125 for appropriate R knee function for step negotiation, squatting, and sitting Baseline: 0-75 Goal status: INITIAL  3.  Pt will demonstrate R knee and hip strength of 4+/5 for appropriate R knee function with daily activities Baseline: Grossly 3/5 for R LE Goal status: INITIAL  4.  Improve 5xSTS by MCID of 5 and by MCID of 55ft as indication of improved functional mobility  Baseline: TBA when pt is able to walk without the knee brace Goal status:  INITIAL  5.  Pt will be Ind with ambulation walking with a normalized gait pattern and to be able to negotiate steps in a reciprocal pattern with only HR assist Baseline:  Goal status: INITIAL  6.  Pt's LEFS score will improve by the MCID to 50% or greater as indication of improved function  Baseline: 30% Goal status: INITIAL   PLAN:  PT FREQUENCY: 1-2x/week  PT DURATION: 8 weeks  PLANNED INTERVENTIONS: 97164- PT Re-evaluation, 97110-Therapeutic exercises, 97530- Therapeutic activity, 97112- Neuromuscular re-education, 97535- Self Care, 02859- Manual therapy, [id number]- Gait training, [id number]- Aquatic Therapy, [phone]- Electrical stimulation (unattended), 97016- Vasopneumatic device, 20560 (1-2 muscles), 20561 (3+ muscles)- Dry Needling, Patient/Family education, Balance training, Stair training, Taping, Joint mobilization, Cryotherapy, and Moist heat  PLAN FOR NEXT SESSION: Assess response to HEP; progress therex as indicated; use of modalities, manual therapy; and TPDN as indicated.   PHYSICAL THERAPY DISCHARGE SUMMARY  Visits from Start of Care: 2  Current functional level related to goals / functional outcomes: Unknown pt stopped attending PT   Remaining deficits: Unknown pt stopped attending PT   Education / Equipment: HEP   Patient agrees to discharge. Patient goals were not met. Patient is being discharged due to not returning since the last visit. Spoke to pt by phone and she states she is doing well and doesn't need further PT  [employer] MS, PT 03/16/24 6:00 PM

## 2024-03-02 ENCOUNTER — Ambulatory Visit: Payer: MEDICAID

## 2024-03-02 NOTE — Therapy (Incomplete)
 OUTPATIENT PHYSICAL THERAPY LOWER EXTREMITY EVALUATION   Patient Name: Carrie Peterson MRN: [id number] DOB:05-08-1996, 28 y.o., female Today's Date: 03/02/2024  END OF SESSION:    Past Medical History:  Diagnosis Date   Anxiety    Cervical cancer screening 12/23/2016   Overview:   01/2016 per patient PAP WNL, HR HPV negative     Last Assessment & Plan:   01/2016 per patient PAP WNL, HR HPV negative     Depression    Fracture 02/13/2014   Back, 5 years ago.   Nondisplaced fracture of base of fifth metacarpal bone, right hand, initial encounter for closed fracture 12/23/2016   Overview:   Short term disability receipt #[id number]     Last Assessment & Plan:   Being seen by orthopedics  Currently not working b/c of this  Patient filed for short term disability     PTSD (post-traumatic stress disorder)    Spontaneous vaginal delivery 03/07/2019   Ulcer    Past Surgical History:  Procedure Laterality Date   addenoidectomy     MEDIAL PATELLOFEMORAL LIGAMENT REPAIR Right 01/27/2024   Procedure: RECONSTRUCTION, LIGAMENT, MEDIAL PATELLOFEMORAL;  Surgeon: Cristy Bonner DASEN, MD;  Location: [facility];  Service: Orthopedics;  Laterality: Right;  Knee arthroscopy with debridement and medial patellofemoral ligament reconstruction   TONSILLECTOMY AND ADENOIDECTOMY     Patient Active Problem List   Diagnosis Date Noted   Reactive airway disease 04/25/2023   PNA (pneumonia) 04/25/2023   Acute respiratory failure with hypoxia (HCC) 04/25/2023   Anxiety and depression 12/23/2016   Nausea in adult 12/23/2016   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  Rationale for Evaluation and Treatment: Rehabilitation  ONSET DATE:  01/27/24-MPFL  SUBJECTIVE:   SUBJECTIVE STATEMENT: Pt reports in 2018 she fell and fractured her R patella. 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 Pt underwent MPFL reconstruction on 01/27/24, reports her recovery is going well.  PERTINENT HISTORY: PTSD  PAIN:  Are you having pain? Yes: NPRS scale: 0/10. Pain range on eval: 0-7/10 Pain location: R knee Pain description: sharp Aggravating factors: prolonged walking, sometimes for no apparrant reason Relieving factors: Rest, ice  and elevation  PRECAUTIONS: Knee     Phase II (4-12 wks): Transition phase Weight bearing: Full Brace: Discontinue if good quad control. If struggling at 4 weeks unlock to 0-30 and  then 5 weeks 0-90 discontinuing at week 6. Removed for sleeping at week 4. ROM: Passive ROM as tolerated with gentle end range stretching. AROM and  AAROM to tolerance without resistance. Ice: Not directly on skin. Recommend as much as possible at minimum after PT. Exercises: Once no lag on SLR and no limp during gait (usually by 6 wks), can  begin closed-chain quad/core and hamstring strengthening as follows: for weeks 4- 6, only do strengthening with knee bent 60 degrees or more; after 6 weeks, can  begin to advance closed chain strengthening at progressively greater degrees of  extension (advance ~20 degrees per week, such that strengthening is done from full  extension to full flexion by 3 months).  RED FLAGS: None   WEIGHT BEARING RESTRICTIONS: No: WBAT  FALLS:  Has patient fallen in last 6 months? No  LIVING ENVIRONMENT: Lives  with: lives with their family Lives in: House/apartment Stairs: Yes 1 to enter home Has following equipment at home: Hinged knee brace locked at [employer]   OCCUPATION: Not working  PLOF: Independent  PATIENT GOALS: Full ROM and good functional use  NEXT MD VISIT: 3 weeks  OBJECTIVE:  Note: Objective measures were completed at Evaluation unless otherwise  noted.  DIAGNOSTIC FINDINGS: None found in Epic  PATIENT SURVEYS:  LEFS: 24/80  COGNITION: Overall cognitive status: Within functional limits for tasks assessed     SENSATION: WFL  EDEMA:  Minimal swelling observed and palapted of the R knee  MUSCLE LENGTH: Hamstrings: Right NT deg; Left NT deg Debby test: Right NT deg; Left NT deg  POSTURE: No Significant postural limitations  PALPATION: TTP to the R peri-knee  LOWER EXTREMITY ROM:  Active ROM Right eval Left eval Right  02/24/24  Hip flexion     Hip extension     Hip abduction     Hip adduction     Hip internal rotation     Hip external rotation     Knee flexion 75  98 AAROM  Knee extension 0    Ankle dorsiflexion     Ankle plantarflexion     Ankle inversion     Ankle eversion      (Blank rows = not tested)  LOWER EXTREMITY MMT:   Able to complete a SLR x10 c knee brace on. Grossly 3/5 for R LE MMT Right eval Left eval  Hip flexion    Hip extension    Hip abduction    Hip adduction    Hip internal rotation    Hip external rotation    Knee flexion    Knee extension    Ankle dorsiflexion    Ankle plantarflexion    Ankle inversion    Ankle eversion     (Blank rows = not tested)  LOWER EXTREMITY SPECIAL TESTS:  NT  FUNCTIONAL TESTS:  5 times sit to stand: TBA 2 minute walk test: TBA  GAIT: Distance walked: 200' Assistive device utilized: Hinged knee brace locked at [employer] Level of assistance: Modified independence Comments: Walks with good balance with knee brace on                                                                                                                                TREATMENT DATE:  [facility] Adult PT Treatment:                                                DATE: 03/02/24 Therapeutic Exercise: Right SLR 3 way 10 x 2 each  QS into towel 5 sec 2 x 10  Prone h/s curl x 10 Supine heel slide x 10  Supine heel slide AA with slide board and strap x  10 Manual  Therapy: *** Neuromuscular re-ed: *** Therapeutic Activity: *** Modalities: *** Self Care: ***   [facility] Adult PT Treatment:                                                DATE: 02/24/24 Right SLR 3 way 10 x 2 each  QS into towel 5 sec 2 x 10  Prone h/s curl x 10 Supine heel slide x 10  Supine heel slide AA with slide board and strap x 10  [facility] Adult PT Treatment:                                                DATE: 02/09/24 Therapeutic Exercise: Developed, instructed in, and pt completed therex as noted in HEP  Therapeutic Activity: Instruction to asc/dsc steps Modalities: Cold pack 10 mins to the R knee for symptom    PATIENT EDUCATION:  Education details: Eval findings, POC, HEP, self care  Person educated: Patient Education method: Explanation, Demonstration, Tactile cues, Verbal cues, and Handouts Education comprehension: verbalized understanding, returned demonstration, verbal cues required, and tactile cues required  HOME EXERCISE PROGRAM: Access Code: [email or web address] URL: [email or web address] Date: 02/09/2024 Prepared by: Dasie Daft  Exercises - Supine Heel Slide with Strap  - 2 x daily - 7 x weekly - 1 sets - 5 reps - 30 hold - Supine Heel Slide  - 2 x daily - 7 x weekly - 1 sets - 10 reps - 3 hold - Supine Quad Set  - 2 x daily - 7 x weekly - 1 sets - 10 reps - 5 hold - Active Straight Leg Raise with Quad Set  - 2 x daily - 7 x weekly - 1 sets - 10 reps - 3 hold - Supine Isometric Hamstring Set  - 2 x daily - 7 x weekly - 1 sets - 10 reps - 5 hold  ASSESSMENT:  CLINICAL IMPRESSION: Pt is 4 weeks Post op. Sees PA-C today for follow up. No quad lag. She hopes to be discontinued from hinge brace. Her AROM has improved to 90 and AAROM to 98. Will continue per protocol.    EVAL: Patient is a 28 y.o. female who was seen today for physical therapy evaluation and treatment for Right knee arthroscopy with MPFL reconstruction 01/27/24. Rehab was initiated today as  noted above in treatment. R knee ROM and LE strength levels are appropriate for time frame s/p surgery. Pt was provided with a HEP to complete 2x daily. Pt will benefit from skilled PT 1-2x/wk for 8 weeks to address impairments to optimize R knee/LE function with less pain.   OBJECTIVE IMPAIRMENTS: decreased activity tolerance, decreased balance, difficulty walking, decreased ROM, decreased strength, increased edema, and pain.   ACTIVITY LIMITATIONS: lifting, bending, sitting, standing, squatting, stairs, locomotion level, and caring for others  PARTICIPATION LIMITATIONS: meal prep, cleaning, laundry, driving, shopping, and community activity  PERSONAL FACTORS: Past/current experiences and Time since onset of injury/illness/exacerbation are also affecting patient's functional outcome.   REHAB POTENTIAL: Good  CLINICAL DECISION MAKING: Evolving/moderate complexity  EVALUATION COMPLEXITY: Moderate   GOALS: Goals reviewed with patient? Yes  SHORT TERM GOALS: Target date: 02/25/24 Pt will be Ind in an initial HEP  Baseline:  started Goal status: MET  2.  Pt will voice understanding of measures to assist in pain reduction  Baseline: started 02/24/24: understands use of RICE , does not currently have an issue with pain  Goal status: MET   LONG TERM GOALS: Target date: 04/12/24  Pt will be Ind in a final HEP to maintain achieved LOF  Baseline:  Goal status: INITIAL  2.  Pt will demonstrate AROM of the R knee of 0-125 for appropriate R knee function for step negotiation, squatting, and sitting Baseline: 0-75 Goal status: INITIAL  3.  Pt will demonstrate R knee and hip strength of 4+/5 for appropriate R knee function with daily activities Baseline: Grossly 3/5 for R LE Goal status: INITIAL  4.  Improve 5xSTS by MCID of 5 and by MCID of 80ft as indication of improved functional mobility  Baseline: TBA when pt is able to walk without the knee brace Goal status: INITIAL  5.  Pt  will be Ind with ambulation walking with a normalized gait pattern and to be able to negotiate steps in a reciprocal pattern with only HR assist Baseline:  Goal status: INITIAL  6.  Pt's LEFS score will improve by the MCID to 50% or greater as indication of improved function  Baseline: 30% Goal status: INITIAL   PLAN:  PT FREQUENCY: 1-2x/week  PT DURATION: 8 weeks  PLANNED INTERVENTIONS: 97164- PT Re-evaluation, 97110-Therapeutic exercises, 97530- Therapeutic activity, 97112- Neuromuscular re-education, 97535- Self Care, 02859- Manual therapy, [id number]- Gait training, [id number]- Aquatic Therapy, [phone]- Electrical stimulation (unattended), 97016- Vasopneumatic device, 20560 (1-2 muscles), 20561 (3+ muscles)- Dry Needling, Patient/Family education, Balance training, Stair training, Taping, Joint mobilization, Cryotherapy, and Moist heat  PLAN FOR NEXT SESSION: Assess response to HEP; progress therex as indicated; use of modalities, manual therapy; and TPDN as indicated.

## 2024-03-03 ENCOUNTER — Telehealth: Payer: Self-pay

## 2024-03-03 NOTE — Telephone Encounter (Signed)
 LVM re: 03/02/24 no show appt. Advised re: attendance policy and her upcoming appt.

## 2024-03-08 NOTE — Therapy (Incomplete)
 OUTPATIENT PHYSICAL THERAPY LOWER EXTREMITY EVALUATION   Patient Name: Carrie Peterson MRN: [id number] DOB:23-Mar-1996, 28 y.o., female Today's Date: 03/08/2024  END OF SESSION:    Past Medical History:  Diagnosis Date   Anxiety    Cervical cancer screening 12/23/2016   Overview:   01/2016 per patient PAP WNL, HR HPV negative     Last Assessment & Plan:   01/2016 per patient PAP WNL, HR HPV negative     Depression    Fracture 02/13/2014   Back, 5 years ago.   Nondisplaced fracture of base of fifth metacarpal bone, right hand, initial encounter for closed fracture 12/23/2016   Overview:   Short term disability receipt #[id number]     Last Assessment & Plan:   Being seen by orthopedics  Currently not working b/c of this  Patient filed for short term disability     PTSD (post-traumatic stress disorder)    Spontaneous vaginal delivery 03/07/2019   Ulcer    Past Surgical History:  Procedure Laterality Date   addenoidectomy     MEDIAL PATELLOFEMORAL LIGAMENT REPAIR Right 01/27/2024   Procedure: RECONSTRUCTION, LIGAMENT, MEDIAL PATELLOFEMORAL;  Surgeon: Cristy Bonner DASEN, MD;  Location: [facility];  Service: Orthopedics;  Laterality: Right;  Knee arthroscopy with debridement and medial patellofemoral ligament reconstruction   TONSILLECTOMY AND ADENOIDECTOMY     Patient Active Problem List   Diagnosis Date Noted   Reactive airway disease 04/25/2023   PNA (pneumonia) 04/25/2023   Acute respiratory failure with hypoxia (HCC) 04/25/2023   Anxiety and depression 12/23/2016   Nausea in adult 12/23/2016   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  Rationale for Evaluation and Treatment: Rehabilitation  ONSET DATE:  01/27/24-MPFL  SUBJECTIVE:   SUBJECTIVE STATEMENT: Pt reports in 2018 she fell and fractured her R patella. 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 Pt underwent MPFL reconstruction on 01/27/24, reports her recovery is going well.  PERTINENT HISTORY: PTSD  PAIN:  Are you having pain? Yes: NPRS scale: 0/10. Pain range on eval: 0-7/10 Pain location: R knee Pain description: sharp Aggravating factors: prolonged walking, sometimes for no apparrant reason Relieving factors: Rest, ice  and elevation  PRECAUTIONS: Knee     Phase II (4-12 wks): Transition phase Weight bearing: Full Brace: Discontinue if good quad control. If struggling at 4 weeks unlock to 0-30 and  then 5 weeks 0-90 discontinuing at week 6. Removed for sleeping at week 4. ROM: Passive ROM as tolerated with gentle end range stretching. AROM and  AAROM to tolerance without resistance. Ice: Not directly on skin. Recommend as much as possible at minimum after PT. Exercises: Once no lag on SLR and no limp during gait (usually by 6 wks), can  begin closed-chain quad/core and hamstring strengthening as follows: for weeks 4- 6, only do strengthening with knee bent 60 degrees or more; after 6 weeks, can  begin to advance closed chain strengthening at progressively greater degrees of  extension (advance ~20 degrees per week, such that strengthening is done from full  extension to full flexion by 3 months).  RED FLAGS: None   WEIGHT BEARING RESTRICTIONS: No: WBAT  FALLS:  Has patient fallen in last 6 months? No  LIVING ENVIRONMENT: Lives  with: lives with their family Lives in: House/apartment Stairs: Yes 1 to enter home Has following equipment at home: Hinged knee brace locked at [employer]   OCCUPATION: Not working  PLOF: Independent  PATIENT GOALS: Full ROM and good functional use  NEXT MD VISIT: 3 weeks  OBJECTIVE:  Note: Objective measures were completed at Evaluation unless otherwise  noted.  DIAGNOSTIC FINDINGS: None found in Epic  PATIENT SURVEYS:  LEFS: 24/80  COGNITION: Overall cognitive status: Within functional limits for tasks assessed     SENSATION: WFL  EDEMA:  Minimal swelling observed and palapted of the R knee  MUSCLE LENGTH: Hamstrings: Right NT deg; Left NT deg Debby test: Right NT deg; Left NT deg  POSTURE: No Significant postural limitations  PALPATION: TTP to the R peri-knee  LOWER EXTREMITY ROM:  Active ROM Right eval Left eval Right  02/24/24  Hip flexion     Hip extension     Hip abduction     Hip adduction     Hip internal rotation     Hip external rotation     Knee flexion 75  98 AAROM  Knee extension 0    Ankle dorsiflexion     Ankle plantarflexion     Ankle inversion     Ankle eversion      (Blank rows = not tested)  LOWER EXTREMITY MMT:   Able to complete a SLR x10 c knee brace on. Grossly 3/5 for R LE MMT Right eval Left eval  Hip flexion    Hip extension    Hip abduction    Hip adduction    Hip internal rotation    Hip external rotation    Knee flexion    Knee extension    Ankle dorsiflexion    Ankle plantarflexion    Ankle inversion    Ankle eversion     (Blank rows = not tested)  LOWER EXTREMITY SPECIAL TESTS:  NT  FUNCTIONAL TESTS:  5 times sit to stand: TBA 2 minute walk test: TBA  GAIT: Distance walked: 200' Assistive device utilized: Hinged knee brace locked at [employer] Level of assistance: Modified independence Comments: Walks with good balance with knee brace on                                                                                                                                TREATMENT DATE:  [facility] Adult PT Treatment:                                                DATE: 03/08/24 Therapeutic Exercise: Right SLR 3 way 10 x 2 each  QS into towel 5 sec 2 x 10  Prone h/s curl x 10 Supine heel slide x 10  Supine heel slide AA with slide board and strap x  10 Manual  Therapy: *** Neuromuscular re-ed: *** Therapeutic Activity: *** Modalities: *** Self Care: ***   [facility] Adult PT Treatment:                                                DATE: 02/24/24 Right SLR 3 way 10 x 2 each  QS into towel 5 sec 2 x 10  Prone h/s curl x 10 Supine heel slide x 10  Supine heel slide AA with slide board and strap x 10  [facility] Adult PT Treatment:                                                DATE: 02/09/24 Therapeutic Exercise: Developed, instructed in, and pt completed therex as noted in HEP  Therapeutic Activity: Instruction to asc/dsc steps Modalities: Cold pack 10 mins to the R knee for symptom    PATIENT EDUCATION:  Education details: Eval findings, POC, HEP, self care  Person educated: Patient Education method: Explanation, Demonstration, Tactile cues, Verbal cues, and Handouts Education comprehension: verbalized understanding, returned demonstration, verbal cues required, and tactile cues required  HOME EXERCISE PROGRAM: Access Code: [email or web address] URL: [email or web address] Date: 02/09/2024 Prepared by: Dasie Daft  Exercises - Supine Heel Slide with Strap  - 2 x daily - 7 x weekly - 1 sets - 5 reps - 30 hold - Supine Heel Slide  - 2 x daily - 7 x weekly - 1 sets - 10 reps - 3 hold - Supine Quad Set  - 2 x daily - 7 x weekly - 1 sets - 10 reps - 5 hold - Active Straight Leg Raise with Quad Set  - 2 x daily - 7 x weekly - 1 sets - 10 reps - 3 hold - Supine Isometric Hamstring Set  - 2 x daily - 7 x weekly - 1 sets - 10 reps - 5 hold  ASSESSMENT:  CLINICAL IMPRESSION: Pt is 4 weeks Post op. Sees PA-C today for follow up. No quad lag. She hopes to be discontinued from hinge brace. Her AROM has improved to 90 and AAROM to 98. Will continue per protocol.    EVAL: Patient is a 28 y.o. female who was seen today for physical therapy evaluation and treatment for Right knee arthroscopy with MPFL reconstruction 01/27/24. Rehab was initiated today as  noted above in treatment. R knee ROM and LE strength levels are appropriate for time frame s/p surgery. Pt was provided with a HEP to complete 2x daily. Pt will benefit from skilled PT 1-2x/wk for 8 weeks to address impairments to optimize R knee/LE function with less pain.   OBJECTIVE IMPAIRMENTS: decreased activity tolerance, decreased balance, difficulty walking, decreased ROM, decreased strength, increased edema, and pain.   ACTIVITY LIMITATIONS: lifting, bending, sitting, standing, squatting, stairs, locomotion level, and caring for others  PARTICIPATION LIMITATIONS: meal prep, cleaning, laundry, driving, shopping, and community activity  PERSONAL FACTORS: Past/current experiences and Time since onset of injury/illness/exacerbation are also affecting patient's functional outcome.   REHAB POTENTIAL: Good  CLINICAL DECISION MAKING: Evolving/moderate complexity  EVALUATION COMPLEXITY: Moderate   GOALS: Goals reviewed with patient? Yes  SHORT TERM GOALS: Target date: 02/25/24 Pt will be Ind in an initial HEP  Baseline:  started Goal status: MET  2.  Pt will voice understanding of measures to assist in pain reduction  Baseline: started 02/24/24: understands use of RICE , does not currently have an issue with pain  Goal status: MET   LONG TERM GOALS: Target date: 04/12/24  Pt will be Ind in a final HEP to maintain achieved LOF  Baseline:  Goal status: INITIAL  2.  Pt will demonstrate AROM of the R knee of 0-125 for appropriate R knee function for step negotiation, squatting, and sitting Baseline: 0-75 Goal status: INITIAL  3.  Pt will demonstrate R knee and hip strength of 4+/5 for appropriate R knee function with daily activities Baseline: Grossly 3/5 for R LE Goal status: INITIAL  4.  Improve 5xSTS by MCID of 5 and by MCID of 6ft as indication of improved functional mobility  Baseline: TBA when pt is able to walk without the knee brace Goal status: INITIAL  5.  Pt  will be Ind with ambulation walking with a normalized gait pattern and to be able to negotiate steps in a reciprocal pattern with only HR assist Baseline:  Goal status: INITIAL  6.  Pt's LEFS score will improve by the MCID to 50% or greater as indication of improved function  Baseline: 30% Goal status: INITIAL   PLAN:  PT FREQUENCY: 1-2x/week  PT DURATION: 8 weeks  PLANNED INTERVENTIONS: 97164- PT Re-evaluation, 97110-Therapeutic exercises, 97530- Therapeutic activity, 97112- Neuromuscular re-education, 97535- Self Care, 02859- Manual therapy, [id number]- Gait training, [phone]- Aquatic Therapy, [phone]- Electrical stimulation (unattended), 97016- Vasopneumatic device, 20560 (1-2 muscles), 20561 (3+ muscles)- Dry Needling, Patient/Family education, Balance training, Stair training, Taping, Joint mobilization, Cryotherapy, and Moist heat  PLAN FOR NEXT SESSION: Assess response to HEP; progress therex as indicated; use of modalities, manual therapy; and TPDN as indicated.

## 2024-03-09 ENCOUNTER — Ambulatory Visit: Payer: MEDICAID

## 2024-03-15 NOTE — Therapy (Incomplete)
 OUTPATIENT PHYSICAL THERAPY LOWER EXTREMITY EVALUATION   Patient Name: Kimie Pidcock MRN: [id number] DOB:04-22-1996, 28 y.o., female Today's Date: 03/15/2024  END OF SESSION:    Past Medical History:  Diagnosis Date   Anxiety    Cervical cancer screening 12/23/2016   Overview:   01/2016 per patient PAP WNL, HR HPV negative     Last Assessment & Plan:   01/2016 per patient PAP WNL, HR HPV negative     Depression    Fracture 02/13/2014   Back, 5 years ago.   Nondisplaced fracture of base of fifth metacarpal bone, right hand, initial encounter for closed fracture 12/23/2016   Overview:   Short term disability receipt #[id number]     Last Assessment & Plan:   Being seen by orthopedics  Currently not working b/c of this  Patient filed for short term disability     PTSD (post-traumatic stress disorder)    Spontaneous vaginal delivery 03/07/2019   Ulcer    Past Surgical History:  Procedure Laterality Date   addenoidectomy     MEDIAL PATELLOFEMORAL LIGAMENT REPAIR Right 01/27/2024   Procedure: RECONSTRUCTION, LIGAMENT, MEDIAL PATELLOFEMORAL;  Surgeon: Cristy Bonner DASEN, MD;  Location: [facility];  Service: Orthopedics;  Laterality: Right;  Knee arthroscopy with debridement and medial patellofemoral ligament reconstruction   TONSILLECTOMY AND ADENOIDECTOMY     Patient Active Problem List   Diagnosis Date Noted   Reactive airway disease 04/25/2023   PNA (pneumonia) 04/25/2023   Acute respiratory failure with hypoxia (HCC) 04/25/2023   Anxiety and depression 12/23/2016   Nausea in adult 12/23/2016   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  Rationale for Evaluation and Treatment: Rehabilitation  ONSET DATE:  01/27/24-MPFL  SUBJECTIVE:   SUBJECTIVE STATEMENT: Pt reports in 2018 she fell and fractured her R patella. 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 Pt underwent MPFL reconstruction on 01/27/24, reports her recovery is going well.  PERTINENT HISTORY: PTSD  PAIN:  Are you having pain? Yes: NPRS scale: 0/10. Pain range on eval: 0-7/10 Pain location: R knee Pain description: sharp Aggravating factors: prolonged walking, sometimes for no apparrant reason Relieving factors: Rest, ice  and elevation  PRECAUTIONS: Knee     Phase II (4-12 wks): Transition phase Weight bearing: Full Brace: Discontinue if good quad control. If struggling at 4 weeks unlock to 0-30 and  then 5 weeks 0-90 discontinuing at week 6. Removed for sleeping at week 4. ROM: Passive ROM as tolerated with gentle end range stretching. AROM and  AAROM to tolerance without resistance. Ice: Not directly on skin. Recommend as much as possible at minimum after PT. Exercises: Once no lag on SLR and no limp during gait (usually by 6 wks), can  begin closed-chain quad/core and hamstring strengthening as follows: for weeks 4- 6, only do strengthening with knee bent 60 degrees or more; after 6 weeks, can  begin to advance closed chain strengthening at progressively greater degrees of  extension (advance ~20 degrees per week, such that strengthening is done from full  extension to full flexion by 3 months).  RED FLAGS: None   WEIGHT BEARING RESTRICTIONS: No: WBAT  FALLS:  Has patient fallen in last 6 months? No  LIVING ENVIRONMENT: Lives  with: lives with their family Lives in: House/apartment Stairs: Yes 1 to enter home Has following equipment at home: Hinged knee brace locked at [employer]   OCCUPATION: Not working  PLOF: Independent  PATIENT GOALS: Full ROM and good functional use  NEXT MD VISIT: 3 weeks  OBJECTIVE:  Note: Objective measures were completed at Evaluation unless otherwise  noted.  DIAGNOSTIC FINDINGS: None found in Epic  PATIENT SURVEYS:  LEFS: 24/80  COGNITION: Overall cognitive status: Within functional limits for tasks assessed     SENSATION: WFL  EDEMA:  Minimal swelling observed and palapted of the R knee  MUSCLE LENGTH: Hamstrings: Right NT deg; Left NT deg Debby test: Right NT deg; Left NT deg  POSTURE: No Significant postural limitations  PALPATION: TTP to the R peri-knee  LOWER EXTREMITY ROM:  Active ROM Right eval Left eval Right  02/24/24  Hip flexion     Hip extension     Hip abduction     Hip adduction     Hip internal rotation     Hip external rotation     Knee flexion 75  98 AAROM  Knee extension 0    Ankle dorsiflexion     Ankle plantarflexion     Ankle inversion     Ankle eversion      (Blank rows = not tested)  LOWER EXTREMITY MMT:   Able to complete a SLR x10 c knee brace on. Grossly 3/5 for R LE MMT Right eval Left eval  Hip flexion    Hip extension    Hip abduction    Hip adduction    Hip internal rotation    Hip external rotation    Knee flexion    Knee extension    Ankle dorsiflexion    Ankle plantarflexion    Ankle inversion    Ankle eversion     (Blank rows = not tested)  LOWER EXTREMITY SPECIAL TESTS:  NT  FUNCTIONAL TESTS:  5 times sit to stand: TBA 2 minute walk test: TBA  GAIT: Distance walked: 200' Assistive device utilized: Hinged knee brace locked at [employer] Level of assistance: Modified independence Comments: Walks with good balance with knee brace on                                                                                                                                TREATMENT DATE:  [facility] Adult PT Treatment:                                                DATE: 03/16/24 Therapeutic Exercise: Right SLR 3 way 10 x 2 each  QS into towel 5 sec 2 x 10  Prone h/s curl x 10 Supine heel slide x 10  Supine heel slide AA with slide board and strap x  10 Manual  Therapy: *** Neuromuscular re-ed: *** Therapeutic Activity: *** Modalities: *** Self Care: ***   [facility] Adult PT Treatment:                                                DATE: 02/24/24 Right SLR 3 way 10 x 2 each  QS into towel 5 sec 2 x 10  Prone h/s curl x 10 Supine heel slide x 10  Supine heel slide AA with slide board and strap x 10  [facility] Adult PT Treatment:                                                DATE: 02/09/24 Therapeutic Exercise: Developed, instructed in, and pt completed therex as noted in HEP  Therapeutic Activity: Instruction to asc/dsc steps Modalities: Cold pack 10 mins to the R knee for symptom    PATIENT EDUCATION:  Education details: Eval findings, POC, HEP, self care  Person educated: Patient Education method: Explanation, Demonstration, Tactile cues, Verbal cues, and Handouts Education comprehension: verbalized understanding, returned demonstration, verbal cues required, and tactile cues required  HOME EXERCISE PROGRAM: Access Code: [email or web address] URL: [email or web address] Date: 02/09/2024 Prepared by: Dasie Daft  Exercises - Supine Heel Slide with Strap  - 2 x daily - 7 x weekly - 1 sets - 5 reps - 30 hold - Supine Heel Slide  - 2 x daily - 7 x weekly - 1 sets - 10 reps - 3 hold - Supine Quad Set  - 2 x daily - 7 x weekly - 1 sets - 10 reps - 5 hold - Active Straight Leg Raise with Quad Set  - 2 x daily - 7 x weekly - 1 sets - 10 reps - 3 hold - Supine Isometric Hamstring Set  - 2 x daily - 7 x weekly - 1 sets - 10 reps - 5 hold  ASSESSMENT:  CLINICAL IMPRESSION: Pt is 4 weeks Post op. Sees PA-C today for follow up. No quad lag. She hopes to be discontinued from hinge brace. Her AROM has improved to 90 and AAROM to 98. Will continue per protocol.    EVAL: Patient is a 28 y.o. female who was seen today for physical therapy evaluation and treatment for Right knee arthroscopy with MPFL reconstruction 01/27/24. Rehab was initiated today as  noted above in treatment. R knee ROM and LE strength levels are appropriate for time frame s/p surgery. Pt was provided with a HEP to complete 2x daily. Pt will benefit from skilled PT 1-2x/wk for 8 weeks to address impairments to optimize R knee/LE function with less pain.   OBJECTIVE IMPAIRMENTS: decreased activity tolerance, decreased balance, difficulty walking, decreased ROM, decreased strength, increased edema, and pain.   ACTIVITY LIMITATIONS: lifting, bending, sitting, standing, squatting, stairs, locomotion level, and caring for others  PARTICIPATION LIMITATIONS: meal prep, cleaning, laundry, driving, shopping, and community activity  PERSONAL FACTORS: Past/current experiences and Time since onset of injury/illness/exacerbation are also affecting patient's functional outcome.   REHAB POTENTIAL: Good  CLINICAL DECISION MAKING: Evolving/moderate complexity  EVALUATION COMPLEXITY: Moderate   GOALS: Goals reviewed with patient? Yes  SHORT TERM GOALS: Target date: 02/25/24 Pt will be Ind in an initial HEP  Baseline:  started Goal status: MET  2.  Pt will voice understanding of measures to assist in pain reduction  Baseline: started 02/24/24: understands use of RICE , does not currently have an issue with pain  Goal status: MET   LONG TERM GOALS: Target date: 04/12/24  Pt will be Ind in a final HEP to maintain achieved LOF  Baseline:  Goal status: INITIAL  2.  Pt will demonstrate AROM of the R knee of 0-125 for appropriate R knee function for step negotiation, squatting, and sitting Baseline: 0-75 Goal status: INITIAL  3.  Pt will demonstrate R knee and hip strength of 4+/5 for appropriate R knee function with daily activities Baseline: Grossly 3/5 for R LE Goal status: INITIAL  4.  Improve 5xSTS by MCID of 5 and by MCID of 29ft as indication of improved functional mobility  Baseline: TBA when pt is able to walk without the knee brace Goal status: INITIAL  5.  Pt  will be Ind with ambulation walking with a normalized gait pattern and to be able to negotiate steps in a reciprocal pattern with only HR assist Baseline:  Goal status: INITIAL  6.  Pt's LEFS score will improve by the MCID to 50% or greater as indication of improved function  Baseline: 30% Goal status: INITIAL   PLAN:  PT FREQUENCY: 1-2x/week  PT DURATION: 8 weeks  PLANNED INTERVENTIONS: 97164- PT Re-evaluation, 97110-Therapeutic exercises, 97530- Therapeutic activity, 97112- Neuromuscular re-education, 97535- Self Care, 02859- Manual therapy, [id number]- Gait training, [phone]- Aquatic Therapy, [phone]- Electrical stimulation (unattended), 97016- Vasopneumatic device, 20560 (1-2 muscles), 20561 (3+ muscles)- Dry Needling, Patient/Family education, Balance training, Stair training, Taping, Joint mobilization, Cryotherapy, and Moist heat  PLAN FOR NEXT SESSION: Assess response to HEP; progress therex as indicated; use of modalities, manual therapy; and TPDN as indicated.

## 2024-03-16 ENCOUNTER — Telehealth: Payer: Self-pay

## 2024-03-16 ENCOUNTER — Ambulatory Visit: Payer: MEDICAID | Attending: Orthopaedic Surgery

## 2024-03-16 NOTE — Telephone Encounter (Signed)
 3rd no show. Spoke with pt by phone. Pt states she is doing well and does not need further PT. Pt is I agreement with DC at this time.
